# Patient Record
Sex: Female | Born: 1965 | Race: Black or African American | Hispanic: No | Marital: Single | Smoking: Never smoker
Health system: Southern US, Community
[De-identification: ages and names within clinical notes are randomized; demographics above are authoritative.]

## PROBLEM LIST (undated history)

## (undated) DIAGNOSIS — C50919 Malignant neoplasm of unspecified site of unspecified female breast: Secondary | ICD-10-CM

## (undated) DIAGNOSIS — Z923 Personal history of irradiation: Secondary | ICD-10-CM

## (undated) HISTORY — DX: Malignant neoplasm of unspecified site of unspecified female breast: C50.919

## (undated) HISTORY — PX: WISDOM TOOTH EXTRACTION: SHX21

## (undated) HISTORY — DX: Personal history of irradiation: Z92.3

---
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
## 2000-07-19 ENCOUNTER — Inpatient Hospital Stay (HOSPITAL_COMMUNITY): Admission: AD | Admit: 2000-07-19 | Discharge: 2000-07-21 | Payer: Self-pay | Admitting: Obstetrics

## 2000-07-24 ENCOUNTER — Inpatient Hospital Stay (HOSPITAL_COMMUNITY): Admission: AD | Admit: 2000-07-24 | Discharge: 2000-07-24 | Payer: Self-pay | Admitting: Obstetrics & Gynecology

## 2010-04-25 ENCOUNTER — Emergency Department (HOSPITAL_COMMUNITY): Admission: EM | Admit: 2010-04-25 | Discharge: 2010-04-25 | Payer: Self-pay | Admitting: Emergency Medicine

## 2010-12-13 NOTE — Op Note (Signed)
[facility]  Patient:    Michelle Petty, Michelle Petty                        MRN: [id number] Dictator:   Jamey Reas, M.D.                           Operative Report  DATE OF BIRTH:                04-11-1966  PREOPERATIVE DIAGNOSES:       Nonreassuring fetal heart tones with persistent deep variables with late onset.  POSTOPERATIVE DIAGNOSES:      Nonreassuring fetal heart tones with persistent deep variables with late onset, uterine rupture in the right lower segment approximately 2 cm in size with extravasation of amnio-infusion, persistent OP, then asynclitism of the fetal head.  PROCEDURE:                    Primary low transverse cesarean section via Pfannenstiel.  SURGEON:                      Georgina Peer, M.D.  ASSISTANT:                    Jamey Reas, M.D.  ANESTHESIA:                   Epidural.  COMPLICATIONS:                None.  ESTIMATED BLOOD LOSS:         900 cc.  FLUIDS:                       Lactated Ringers 1500 cc.  URINE OUTPUT:                 200 cc clear urine at the end of the procedure.  INDICATIONS:                  A 45 year old G2, P1-0-0-1 approximately 64 and 4 with poor dating secondary to no prenatal care presented in early labor. Deep variable decelerations with labor.  FINDINGS:                     Viable female infant in cephalic presentation, persisting OP, and asynclitism of the fetal head, clear fluid at the time of delivery.  Apgars 9 and 9.  Cord pH 7.20.  Approximately 2 cm in size uterine rupture in the right lower uterine segment with extravasation of amnio-infusion fluid into the lower uterine segment and broad ligament structures bilaterally.  PROCEDURE:                    Patient was taken to the operating room where epidural anesthesia was found to be adequate.  She was then prepped and draped in the normal sterile fashion in the dorsal supine position with a leftward tilt.   A Pfannenstiel skin incision was then made with the scalpel and carried through to the underlying layer of fascia.  The fascia was incised in the midline, the incision extended laterally with the Mayo scissors.  The superior aspect of the fascial incision was then grasped with Kocher clamps, elevated, and the underlying rectus muscles dissected off bluntly.  Attention was then turned to the inferior aspect of the incision  which in a similar fashion was grasped, tented up with Kocher clamps, and the rectus muscle dissected off bluntly.  The rectus muscles were then separated in the midline, the peritoneum identified, tented up, and entered sharply with Metzenbaum scissors.  The peritoneal incision was then extended superiorly and inferiorly with good visualization of the bladder.  Upon entering the peritoneal cavity it was noted a significant amount of clear fluid had extravasated into the lower uterine segment as well as the bilateral and broad ligament structures. The lower uterine segment was inspected well and a 2 cm hole was found in the right lower uterine segment.  At that point the vesicouterine peritoneum was identified, tented up, and incision was made extending laterally after insertion of the bladder blade.  The incision was extended laterally.  A bladder flap was created digitally.  Starting at the rupture site in the right lower uterine segment the uterine incision was made medially and laterally cephalad direction using bandage scissors.  The fetal head was delivered atraumatically after the bladder blade was removed.  The nose and mouth were suctioned with bulb suction, the cord clamped and cut.  The infant was handed to the awaiting pediatricians.  Cord gases were sent.  The placenta was then removed manually, the uterus exteriorized and cleared of all clots and debris. Uterine incision and extension were repaired with 0 Vicryl suture in a running locked fashion.  A second  layer of suture was used on the extension to achieve excellent hemostasis.  The uterus was returned to the abdomen where the incision was found to be hemostatic after several interrupted figure-of-eight sutures were applied to the lower uterine segment.  The gutters were cleared of all clots and debris using adequate irritation.  The fascia was reapproximated with 0 Vicryl in a running fashion.  Skin was closed with staples.  The patient tolerated procedure well.  Sponge, lap, and needle counts were correct x 2.  Cefotan 2 g was given at cord clamp.  The patient was taken to the recovery room in stable condition. DD:  07/19/00 TD:  07/19/00 Job: [id number]

## 2010-12-13 NOTE — Discharge Summary (Signed)
[facility]  Patient:    Michelle Petty, Michelle Petty                        MRN: [id number] Adm. Date:  [id number] Disc. Date: 07/21/00 Attending:  Tammi Sou Dictator:   Kinnie Scales. Reed Breech, M.D. CC:         Georgina Peer, M.D.   Discharge Summary  DATE OF BIRTH:                February 27, 1966.  DISCHARGE DIAGNOSES:          1. Status post low transverse cesarean section                                  secondary to uterine rupture and                                  non-reassuring fetal heart tones.                               2. Uterine rupture.                               3. Postoperative anemia.  DISCHARGE MEDICATIONS:        1. Ibuprofen 600 mg one p.o. q.6-8h. p.r.n.                                  pain.                               2. Darvocet-N 100 one to two p.o. q.6h. p.r.n.                                  pain.                               3. Prenatal vitamins, one p.o. q.d. x 6 weeks.                               4. Iron sulfate 325 mg one p.o. b.i.d. x 4                                  weeks.                               5. Genora 1/35 one p.o. q.d.  CONSULTANTS:                  None.  PROCEDURE:                    Low transverse cesarean section on  July 19, 2000, Dr. Georgina Peer and                               Dr. Jamey Reas.  ADMISSION HISTORY:            [age]-year-old gravida 2, para 1-0-0-1, presents at 40 weeks and 4 days with contractions, positive fetal movement. Said she had contractions at 1600 hours today, now getting closer together and stronger pain, rated at 7/10, tolerable but forceful.  Patient reports that it is an unplanned pregnancy and she has not had any prenatal care. Approximately four months previously, she was seen at "a place on [address]" for ultrasound at 16 weeks; was at a clinic for possible termination  of pregnancy.  PHYSICAL EXAMINATION:         Patient was afebrile, noted to have normal blood pressure of 118/72, pulse 70.  Fetal heart tones 130 to 140, no accelerations, no decelerations.  Uterine contractions every two to five minutes.  Cervix dilated 3/50/-2, vertex.  Urine drug screen negative.  Hemoglobin 8.4. Patient was admitted to L&D for expectant management.  HOSPITAL COURSE:              OB:  On July 19, 2000, patient was noted to have variable decelerations with fetal heart tones dropping to 60 to 70 beats a minute for one to two minutes since rupture of membrane.  Patient repositioned and variables resolved.  Patient was then noted in the afternoon on the 23rd to have non-reassuring fetal heart rate with persistent frequent variables and late onset, unresponsive to conservative measures.  Patient was taken to a C-section.  Patient underwent an LTCS and it was noted that she had a 2-cm uterine rupture at right lower segment and extravasation of amnioinfusion fluid into lower uterine segment and broad ligaments bilaterally.  Patient had an estimated blood loss of 900 cc.  She tolerated procedure well.  Postop, patient managing well, decided to keep baby and would like to be discharged on July 21, 2000.  Discharge ______, patient is doing well today, still has some abdominal discomfort but ambulating without too much difficulty.  She denies any nausea or vomiting.  Patient is afebrile at 98.0, heart rate 84, blood pressure 100/70.  Clear to auscultation bilaterally.  Regular rate and rhythm.  Fundus 1 cm below umbilicus and firm. Extremities:  No cyanosis, clubbing or edema.  Hemoglobin 6.7.  FOLLOWUP:                     Patient will follow up with [facility] in six weeks.  She will take prenatal vitamins for six weeks as well as iron supplementation for one month with vitamin C.  Patient is encouraged not to labor again secondary to her uterine rupture.  ______  and take oral contraceptive pills. DD:  07/21/00 TD:  07/21/00 Job: [id number]

## 2012-07-28 DIAGNOSIS — Z923 Personal history of irradiation: Secondary | ICD-10-CM

## 2012-07-28 HISTORY — DX: Personal history of irradiation: Z92.3

## 2012-12-22 ENCOUNTER — Ambulatory Visit: Payer: Self-pay | Admitting: Obstetrics

## 2013-01-06 ENCOUNTER — Ambulatory Visit (INDEPENDENT_AMBULATORY_CARE_PROVIDER_SITE_OTHER): Payer: Medicaid Other | Admitting: Obstetrics

## 2013-01-06 ENCOUNTER — Encounter: Payer: Self-pay | Admitting: Obstetrics

## 2013-01-06 VITALS — BP 112/77 | HR 74 | Temp 98.6°F | Ht 68.0 in | Wt 152.0 lb

## 2013-01-06 DIAGNOSIS — Z0000 Encounter for general adult medical examination without abnormal findings: Secondary | ICD-10-CM

## 2013-01-06 DIAGNOSIS — Z113 Encounter for screening for infections with a predominantly sexual mode of transmission: Secondary | ICD-10-CM

## 2013-01-06 DIAGNOSIS — N760 Acute vaginitis: Secondary | ICD-10-CM

## 2013-01-06 DIAGNOSIS — N921 Excessive and frequent menstruation with irregular cycle: Secondary | ICD-10-CM

## 2013-01-06 DIAGNOSIS — Z1239 Encounter for other screening for malignant neoplasm of breast: Secondary | ICD-10-CM

## 2013-01-06 DIAGNOSIS — N949 Unspecified condition associated with female genital organs and menstrual cycle: Secondary | ICD-10-CM

## 2013-01-06 DIAGNOSIS — Z01419 Encounter for gynecological examination (general) (routine) without abnormal findings: Secondary | ICD-10-CM

## 2013-01-06 LAB — CBC
HCT: 35.5 % — ABNORMAL LOW (ref 36.0–46.0)
Hemoglobin: 12.1 g/dL (ref 12.0–15.0)
MCH: 28.1 pg (ref 26.0–34.0)
MCHC: 34.1 g/dL (ref 30.0–36.0)
MCV: 82.6 fL (ref 78.0–100.0)

## 2013-01-06 LAB — COMPREHENSIVE METABOLIC PANEL
ALT: 10 U/L (ref 0–35)
AST: 16 U/L (ref 0–37)
Albumin: 4.1 g/dL (ref 3.5–5.2)
Alkaline Phosphatase: 53 U/L (ref 39–117)
Calcium: 9.1 mg/dL (ref 8.4–10.5)
Chloride: 104 mEq/L (ref 96–112)
Potassium: 4.3 mEq/L (ref 3.5–5.3)
Sodium: 139 mEq/L (ref 135–145)
Total Protein: 7.2 g/dL (ref 6.0–8.3)

## 2013-01-06 MED ORDER — METRONIDAZOLE 500 MG PO TABS: 500.0000 mg | ORAL_TABLET | Freq: Two times a day (BID) | ORAL | Status: DC

## 2013-01-06 NOTE — Progress Notes (Signed)
Subjective:     Michelle Petty is a 47 y.o. female here for a routine exam with STD screen.  Current complaints: irregular cycles.     Gynecologic History Patient's last menstrual period was 01/01/2013. Contraception: abstinence Last Pap: 2008. Results were: normal Last mammogram: n/a. Results were: n/a     The following portions of the patient's history were reviewed and updated as appropriate: allergies, current medications, past family history, past medical history, past social history, past surgical history and problem list.  Review of Systems Pertinent items are noted in HPI.    Objective:    General appearance: alert and no distress Breasts: normal appearance, no masses or tenderness Abdomen: normal findings: soft, non-tender Pelvic: cervix normal in appearance, external genitalia normal, no adnexal masses or tenderness, no cervical motion tenderness, rectovaginal septum normal and uterus normal size, shape, and consistency   Vagina with grayish discharge.  Assessment:    Healthy female exam.    Metrorrhagia.  BV.   Plan:    Education reviewed: calcium supplements, self breast exams, weight bearing exercise and BV. Mammogram ordered. Follow up in: 1 year. Flagyl Rx.   Sonohysterogram ordered.

## 2013-01-07 ENCOUNTER — Ambulatory Visit (HOSPITAL_COMMUNITY)
Admission: RE | Admit: 2013-01-07 | Discharge: 2013-01-07 | Disposition: A | Discharge status: Home / Self Care | Payer: Medicaid Other | Source: Ambulatory Visit | Attending: Obstetrics | Admitting: Obstetrics

## 2013-01-07 DIAGNOSIS — Z1231 Encounter for screening mammogram for malignant neoplasm of breast: Secondary | ICD-10-CM | POA: Insufficient documentation

## 2013-01-07 DIAGNOSIS — Z1239 Encounter for other screening for malignant neoplasm of breast: Secondary | ICD-10-CM

## 2013-01-07 DIAGNOSIS — N921 Excessive and frequent menstruation with irregular cycle: Secondary | ICD-10-CM | POA: Insufficient documentation

## 2013-01-07 DIAGNOSIS — N760 Acute vaginitis: Secondary | ICD-10-CM | POA: Insufficient documentation

## 2013-01-07 LAB — RPR

## 2013-01-07 LAB — HEPATITIS B SURFACE ANTIGEN: Hepatitis B Surface Ag: NEGATIVE

## 2013-01-10 LAB — PAP IG W/ RFLX HPV ASCU

## 2013-01-11 ENCOUNTER — Other Ambulatory Visit: Payer: Self-pay | Admitting: Obstetrics

## 2013-01-11 DIAGNOSIS — R928 Other abnormal and inconclusive findings on diagnostic imaging of breast: Secondary | ICD-10-CM

## 2013-01-19 ENCOUNTER — Other Ambulatory Visit: Payer: Medicaid Other

## 2013-01-19 ENCOUNTER — Ambulatory Visit: Payer: Medicaid Other | Admitting: Obstetrics

## 2013-01-21 ENCOUNTER — Other Ambulatory Visit: Payer: Self-pay | Admitting: Obstetrics

## 2013-01-21 ENCOUNTER — Ambulatory Visit
Admission: RE | Admit: 2013-01-21 | Discharge: 2013-01-21 | Disposition: A | Discharge status: Home / Self Care | Payer: Medicaid Other | Source: Ambulatory Visit | Attending: Obstetrics | Admitting: Obstetrics

## 2013-01-21 DIAGNOSIS — R928 Other abnormal and inconclusive findings on diagnostic imaging of breast: Secondary | ICD-10-CM

## 2013-01-21 DIAGNOSIS — R921 Mammographic calcification found on diagnostic imaging of breast: Secondary | ICD-10-CM

## 2013-01-21 DIAGNOSIS — C50919 Malignant neoplasm of unspecified site of unspecified female breast: Secondary | ICD-10-CM

## 2013-01-21 HISTORY — DX: Malignant neoplasm of unspecified site of unspecified female breast: C50.919

## 2013-01-21 HISTORY — PX: BREAST BIOPSY: SHX20

## 2013-01-24 ENCOUNTER — Other Ambulatory Visit: Payer: Self-pay | Admitting: Obstetrics

## 2013-01-24 DIAGNOSIS — C50912 Malignant neoplasm of unspecified site of left female breast: Secondary | ICD-10-CM

## 2013-01-25 ENCOUNTER — Ambulatory Visit (INDEPENDENT_AMBULATORY_CARE_PROVIDER_SITE_OTHER): Payer: Medicaid Other

## 2013-01-25 ENCOUNTER — Encounter: Payer: Self-pay | Admitting: Obstetrics

## 2013-01-25 ENCOUNTER — Ambulatory Visit (INDEPENDENT_AMBULATORY_CARE_PROVIDER_SITE_OTHER): Payer: Medicaid Other | Admitting: Obstetrics

## 2013-01-25 ENCOUNTER — Other Ambulatory Visit: Payer: Self-pay

## 2013-01-25 VITALS — BP 136/84 | HR 78 | Temp 98.5°F | Wt 152.0 lb

## 2013-01-25 DIAGNOSIS — N921 Excessive and frequent menstruation with irregular cycle: Secondary | ICD-10-CM

## 2013-01-25 DIAGNOSIS — Z853 Personal history of malignant neoplasm of breast: Secondary | ICD-10-CM

## 2013-01-25 DIAGNOSIS — N946 Dysmenorrhea, unspecified: Secondary | ICD-10-CM

## 2013-01-25 DIAGNOSIS — N949 Unspecified condition associated with female genital organs and menstrual cycle: Secondary | ICD-10-CM

## 2013-01-25 HISTORY — DX: Personal history of malignant neoplasm of breast: Z85.3

## 2013-01-25 NOTE — Progress Notes (Signed)
SONOHYSTEROGRAM PROCEDURE NOTE  SONOHYSTEROGRAM PROCEDURE:   A time-out was performed confirming patient name, allergy status and procedure.  A UPT was negative.  A Graves speculum was placed in the vagina.  The cervix was prepped with betadine.  The cervix was grasped with a single tooth tenaculum.  The IUI catherter was primed with sterile water.  The IUI was introduced into the uterine cavity.  The single tooth tenaculum and speculum were removed.  The vaginal probe was placed.  The sterile water was instilled in the uterine cavity.    Sterile water was aspirated.  10 ml syringe attached to IUI and an endometrial biopsy obtained and submitted to Pathology for evaluation. The IUI was removed.  The single tooth tenaculum was removed with minimal bleeding noted from the cervix.  The patient tolerated the procedure well.   FINDINGS:  Normal endometrial cavity.

## 2013-01-26 ENCOUNTER — Other Ambulatory Visit: Payer: Self-pay | Admitting: *Deleted

## 2013-01-26 ENCOUNTER — Telehealth: Payer: Self-pay | Admitting: *Deleted

## 2013-01-26 ENCOUNTER — Encounter: Payer: Self-pay | Admitting: Obstetrics

## 2013-01-26 DIAGNOSIS — C50112 Malignant neoplasm of central portion of left female breast: Secondary | ICD-10-CM

## 2013-01-26 NOTE — Telephone Encounter (Signed)
Called and spoke with patient and confirmed [facility] appt. For 02/02/13 at 1200N.  Instructions and contact information given. No further questions or concerns at this time.

## 2013-01-31 ENCOUNTER — Ambulatory Visit
Admission: RE | Admit: 2013-01-31 | Discharge: 2013-01-31 | Disposition: A | Discharge status: Home / Self Care | Payer: Medicaid Other | Source: Ambulatory Visit | Attending: Obstetrics | Admitting: Obstetrics

## 2013-01-31 DIAGNOSIS — C50912 Malignant neoplasm of unspecified site of left female breast: Secondary | ICD-10-CM

## 2013-01-31 MED ORDER — GADOBENATE DIMEGLUMINE 529 MG/ML IV SOLN
14.0000 mL | Freq: Once | INTRAVENOUS | Status: AC | PRN
  Administered 2013-01-31: 14 mL via INTRAVENOUS

## 2013-02-02 ENCOUNTER — Encounter: Payer: Self-pay | Admitting: *Deleted

## 2013-02-02 ENCOUNTER — Ambulatory Visit: Payer: Medicaid Other

## 2013-02-02 ENCOUNTER — Ambulatory Visit (HOSPITAL_BASED_OUTPATIENT_CLINIC_OR_DEPARTMENT_OTHER): Payer: Medicaid Other | Admitting: Surgery

## 2013-02-02 ENCOUNTER — Ambulatory Visit
Admission: RE | Admit: 2013-02-02 | Discharge: 2013-02-02 | Disposition: A | Discharge status: Home / Self Care | Payer: Medicaid Other | Source: Ambulatory Visit | Attending: Radiation Oncology | Admitting: Radiation Oncology

## 2013-02-02 ENCOUNTER — Other Ambulatory Visit (HOSPITAL_BASED_OUTPATIENT_CLINIC_OR_DEPARTMENT_OTHER): Payer: Medicaid Other | Admitting: Lab

## 2013-02-02 ENCOUNTER — Ambulatory Visit: Payer: Medicaid Other | Attending: Surgery | Admitting: Physical Therapy

## 2013-02-02 ENCOUNTER — Encounter: Payer: Self-pay | Admitting: Oncology

## 2013-02-02 ENCOUNTER — Encounter (INDEPENDENT_AMBULATORY_CARE_PROVIDER_SITE_OTHER): Payer: Self-pay | Admitting: Surgery

## 2013-02-02 ENCOUNTER — Ambulatory Visit (HOSPITAL_BASED_OUTPATIENT_CLINIC_OR_DEPARTMENT_OTHER): Payer: Medicaid Other | Admitting: Oncology

## 2013-02-02 VITALS — BP 128/83 | HR 71 | Temp 98.4°F | Resp 20 | Ht 68.0 in | Wt 150.7 lb

## 2013-02-02 DIAGNOSIS — C50112 Malignant neoplasm of central portion of left female breast: Secondary | ICD-10-CM

## 2013-02-02 DIAGNOSIS — C50919 Malignant neoplasm of unspecified site of unspecified female breast: Secondary | ICD-10-CM

## 2013-02-02 DIAGNOSIS — C50312 Malignant neoplasm of lower-inner quadrant of left female breast: Secondary | ICD-10-CM

## 2013-02-02 DIAGNOSIS — C50912 Malignant neoplasm of unspecified site of left female breast: Secondary | ICD-10-CM

## 2013-02-02 DIAGNOSIS — C50119 Malignant neoplasm of central portion of unspecified female breast: Secondary | ICD-10-CM

## 2013-02-02 DIAGNOSIS — D0590 Unspecified type of carcinoma in situ of unspecified breast: Secondary | ICD-10-CM

## 2013-02-02 DIAGNOSIS — IMO0001 Reserved for inherently not codable concepts without codable children: Secondary | ICD-10-CM | POA: Insufficient documentation

## 2013-02-02 DIAGNOSIS — Z01818 Encounter for other preprocedural examination: Secondary | ICD-10-CM | POA: Insufficient documentation

## 2013-02-02 LAB — COMPREHENSIVE METABOLIC PANEL (CC13)
Alkaline Phosphatase: 53 U/L (ref 40–150)
Glucose: 93 mg/dl (ref 70–140)
Sodium: 139 mEq/L (ref 136–145)
Total Bilirubin: 0.37 mg/dL (ref 0.20–1.20)
Total Protein: 7.3 g/dL (ref 6.4–8.3)

## 2013-02-02 LAB — CBC WITH DIFFERENTIAL/PLATELET
EOS%: 1.2 % (ref 0.0–7.0)
Eosinophils Absolute: 0.1 10*3/uL (ref 0.0–0.5)
LYMPH%: 25.6 % (ref 14.0–49.7)
MCH: 28.8 pg (ref 25.1–34.0)
MCHC: 34.2 g/dL (ref 31.5–36.0)
MCV: 84.2 fL (ref 79.5–101.0)
MONO%: 9 % (ref 0.0–14.0)
NEUT#: 4.1 10*3/uL (ref 1.5–6.5)
Platelets: 243 10*3/uL (ref 145–400)
RBC: 4.24 10*6/uL (ref 3.70–5.45)

## 2013-02-02 NOTE — Progress Notes (Signed)
Patient ID: Michelle Petty, female   DOB: [birth year], 47 y.o.   MRN: [id number]  Chief Complaint  Patient presents with  . Other    left breast cancer    HPI Michelle Petty is a 47 y.o. female.   HPI This is a very pleasant lady referred by Dr. Coral Ceo after the recent diagnosis of a left breast cancer. This was found on screening mammography. She had abnormal calcifications which were biopsied. She has had no problems with her breasts. She denies nipple discharge. She has had no previous biopsies. She is otherwise without complaints. Past Medical History  Diagnosis Date  . Breast cancer     Past Surgical History  Procedure Laterality Date  . Achilles tendon repair Left 1985  . Breast biopsy Left 01/21/13    malignant     Family History  Problem Relation Age of Onset  . Diabetes Mother   . Diabetes Maternal Grandmother     Social History History  Substance Use Topics  . Smoking status: Never Smoker   . Smokeless tobacco: Never Used  . Alcohol Use: No    No Known Allergies  Current Outpatient Prescriptions  Medication Sig Dispense Refill  . ibuprofen (ADVIL,MOTRIN) 600 MG tablet Take 600 mg by mouth every 6 (six) hours as needed for pain.       No current facility-administered medications for this visit.    Review of Systems Review of Systems  Constitutional: Negative for fever, chills and unexpected weight change.  HENT: Negative for hearing loss, congestion, sore throat, trouble swallowing and voice change.   Eyes: Negative for visual disturbance.  Respiratory: Negative for cough and wheezing.   Cardiovascular: Negative for chest pain, palpitations and leg swelling.  Gastrointestinal: Negative for nausea, vomiting, abdominal pain, diarrhea, constipation, blood in stool, abdominal distention and anal bleeding.  Genitourinary: Negative for hematuria, vaginal bleeding and difficulty urinating.  Musculoskeletal: Negative for arthralgias.  Skin: Negative for  rash and wound.  Neurological: Negative for seizures, syncope and headaches.  Hematological: Negative for adenopathy. Does not bruise/bleed easily.  Psychiatric/Behavioral: Negative for confusion.    Last menstrual period 01/01/2013.  Physical Exam Physical Exam  Constitutional: She is oriented to person, place, and time. She appears well-developed and well-nourished. No distress.  HENT:  Head: Normocephalic and atraumatic.  Right Ear: External ear normal.  Left Ear: External ear normal.  Nose: Nose normal.  Mouth/Throat: Oropharynx is clear and moist. No oropharyngeal exudate.  Eyes: Conjunctivae are normal. Pupils are equal, round, and reactive to light. Right eye exhibits no discharge. Left eye exhibits no discharge. No scleral icterus.  Neck: Normal range of motion. Neck supple. No tracheal deviation present. No thyromegaly present.  Cardiovascular: Normal rate, regular rhythm, normal heart sounds and intact distal pulses.   No murmur heard. Pulmonary/Chest: Effort normal and breath sounds normal. No respiratory distress. She has no wheezes. She has no rales.  Abdominal: Soft. Bowel sounds are normal.  Musculoskeletal: Normal range of motion. She exhibits no edema.  Lymphadenopathy:    She has no cervical adenopathy.  Neurological: She is alert and oriented to person, place, and time.  Skin: Skin is warm and dry. No rash noted. She is not diaphoretic. No erythema.  Psychiatric: Her behavior is normal. Judgment normal.    Data Reviewed I reviewed her mammograms MRI and biopsy results showing the invasive left breast cancer which is ER and PR positive.  Assessment    Left breast cancer     Plan  After discussion in the multidisciplinary conference, needle localized left breast lumpectomy and sentinel lymph node biopsy was recommended. I've discussed this with her in detail. I discussed the risks of surgery which includes but is not limited to bleeding, infection, need for  further surgery should the margins are noted to be positive, et Karie Soda. She understands and wished to proceed. Also discussed postoperative recovery. Surgery will be scheduled        Bobie Kistler A 02/02/2013, 2:24 PM

## 2013-02-02 NOTE — Progress Notes (Signed)
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 female who is seen today at the [facility] for evaluation of her T1a N0 M0 invasive ductal/DCIS of the left breast. At the time of a screening mammogram on 01/07/2013 at the [facility] she was felt to have possible masses in the right breast and calcifications within the left breast. Additional views at the [facility] on 01/21/2013 showed subareolar left breast calcifications a. Biopsy was diagnostic for invasive ductal carcinoma along with DCIS. Her invasive disease was ER positive at 100% and PR positive at 60% with a proliferation Ki-67 marker of 11%. Breast MR on 01/31/2013 showed a 2.5 x 2.8 x 2.2 cm hematoma along the lower inner periareolar region at the biopsy site. Lymph nodes appear to be normal. She is without complaints today. She seen today with Dr. Magnus Ivan and Dr. Welton Flakes.  PREVIOUS RADIATION THERAPY: No   PAST MEDICAL HISTORY:  has a past medical history of Breast cancer.     PAST SURGICAL HISTORY:  Past Surgical History  Procedure Laterality Date  . Achilles tendon repair Left 1985  . Breast biopsy Left 01/21/13    malignant      FAMILY HISTORY: family history includes Diabetes in her maternal grandmother and mother. Her father is alive at 23 and doing well. Her mother is alive at 69 of diabetes mellitus. No family history of breast cancer.   SOCIAL HISTORY:  reports that she has never smoked. She has never used smokeless tobacco. She reports that she does not drink alcohol or use illicit drugs. She works in housekeeping. Single, 10 and 75 year old  children.   ALLERGIES: Review of patient's allergies indicates no known allergies.   MEDICATIONS:  Current Outpatient Prescriptions  Medication Sig Dispense Refill  . ibuprofen (ADVIL,MOTRIN) 600 MG tablet Take 600 mg by mouth every 6 (six) hours as needed for pain.       No current facility-administered medications for this encounter.     REVIEW OF SYSTEMS:  Pertinent items are noted in HPI.    PHYSICAL EXAM: Alert and oriented 47 year old African American female appearing younger than her stated age. Wt Readings from Last 3 Encounters:  02/02/13 150 lb 11.2 oz (68.357 kg)  01/25/13 152 lb (68.947 kg)  01/06/13 152 lb (68.947 kg)   Temp Readings from Last 3 Encounters:  02/02/13 98.4 F (36.9 C) Oral  01/25/13 98.5 F (36.9 C) Oral  01/06/13 98.6 F (37 C) Oral   BP Readings from Last 3 Encounters:  02/02/13 128/83  01/25/13 136/84  01/06/13 112/77   Pulse Readings from Last 3 Encounters:  02/02/13 71  01/25/13 78  01/06/13 74   Head and neck examination: Grossly unremarkable. Nodes: Without palpable cervical, supraclavicular, or axillary lymphadenopathy. Chest: Lungs clear. Back: Without spinal or CVA tenderness. Heart: Regular rate and rhythm. Breasts: There is a palpable subareolar mass in approximately 9:00 measuring 2 cm. There is no other palpable abnormality. Right breast without masses or lesions. Abdomen: Without hepatomegaly. Extremities: Without edema.     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 I explained to the patient that  her local treatment options include a partial mastectomy followed by radiation therapy or mastectomy. Dr. Magnus Ivan may need to remove the nipple areolar complex to obtain clear margins. We discussed the potential acute and late toxicities of radiation therapy. We also discussed the possibility of hypo-fractionated radiation therapy, but I am less keen on hyperfractionated treatment for patients age 7. We also discussed genetic testing even with a negative family history. Her prognosis appears to be favorable.   PLAN: As discussed above. She will be represented at the Wednesday morning conference after her definitive surgery.  I spent 30 minutes minutes face to face with the patient and more than 50% of that time was spent in counseling and/or coordination of care.

## 2013-02-02 NOTE — Patient Instructions (Addendum)
Proceed with surgery with a lumpectomy and sentinel lymph node biopsy.  I will see you back in 4-5 weeks time

## 2013-02-03 ENCOUNTER — Telehealth: Payer: Self-pay | Admitting: Oncology

## 2013-02-05 ENCOUNTER — Encounter: Payer: Self-pay | Admitting: *Deleted

## 2013-02-05 NOTE — Progress Notes (Signed)
Mailed after appt letter to pt. 

## 2013-02-08 ENCOUNTER — Telehealth: Payer: Self-pay | Admitting: *Deleted

## 2013-02-08 NOTE — Telephone Encounter (Signed)
Left message for a return phone call.

## 2013-02-09 ENCOUNTER — Other Ambulatory Visit (INDEPENDENT_AMBULATORY_CARE_PROVIDER_SITE_OTHER): Payer: Self-pay | Admitting: General Surgery

## 2013-02-09 DIAGNOSIS — C50912 Malignant neoplasm of unspecified site of left female breast: Secondary | ICD-10-CM

## 2013-02-10 ENCOUNTER — Ambulatory Visit (INDEPENDENT_AMBULATORY_CARE_PROVIDER_SITE_OTHER): Payer: Medicaid Other | Admitting: Obstetrics

## 2013-02-10 VITALS — BP 115/77 | HR 70 | Temp 98.6°F | Wt 150.0 lb

## 2013-02-10 DIAGNOSIS — N921 Excessive and frequent menstruation with irregular cycle: Secondary | ICD-10-CM

## 2013-02-10 MED ORDER — MEDROXYPROGESTERONE ACETATE 150 MG/ML IM SUSP: 150.0000 mg | INTRAMUSCULAR | Status: DC

## 2013-02-10 MED ORDER — MEDROXYPROGESTERONE ACETATE 10 MG PO TABS: 10.0000 mg | ORAL_TABLET | Freq: Every day | ORAL | Status: DC

## 2013-02-10 NOTE — Progress Notes (Signed)
Subjective:     Michelle Petty is a 47 y.o. female here for a routine exam.  Current complaints: test results for hsg.  Personal health questionnaire reviewed: yes.   Gynecologic History Patient's last menstrual period was 01/26/2013. Contraception: none Last Pap: 2014. Results were: normal Last mammogram: 2014. Results were: abnormal- dx with breast cancer and is having a lumpectomy on 02-23-13  Obstetric History OB History   Grav Para Term Preterm Abortions TAB SAB Ect Mult Living   2 2 2  0 0 0 0 0 0 2     # Outc Date GA Lbr Len/2nd Wgt Sex Del Anes PTL Lv   1 TRM            2 TRM                The following portions of the patient's history were reviewed and updated as appropriate: allergies, current medications, past family history, past medical history, past social history, past surgical history and problem list.  Review of Systems Pertinent items are noted in HPI.    Objective:    No exam performed today, Consult only..    Assessment:    Metrorrhagia.  Ultrasound WNL's.   Plan:    Education reviewed: Metrorrhagia. Follow up in: 3 months. Provera Rx

## 2013-02-11 ENCOUNTER — Encounter: Payer: Self-pay | Admitting: Obstetrics

## 2013-02-18 ENCOUNTER — Encounter (HOSPITAL_BASED_OUTPATIENT_CLINIC_OR_DEPARTMENT_OTHER): Payer: Self-pay | Admitting: *Deleted

## 2013-02-18 NOTE — Progress Notes (Signed)
Had labs 02/02/13-no other test needed Pt stopped her hormones

## 2013-02-21 NOTE — H&P (Signed)
Patient ID: Michelle Petty, female DOB: [birth year], 47 y.o. MRN: [id number]  Chief Complaint   Patient presents with   .  Other     left breast cancer   HPI  Michelle Petty is a 47 y.o. female.  HPI  This is a very pleasant lady referred by Dr. Coral Ceo after the recent diagnosis of a left breast cancer. This was found on screening mammography. She had abnormal calcifications which were biopsied. She has had no problems with her breasts. She denies nipple discharge. She has had no previous biopsies. She is otherwise without complaints.  Past Medical History   Diagnosis  Date   .  Breast cancer     Past Surgical History   Procedure  Laterality  Date   .  Achilles tendon repair  Left  1985   .  Breast biopsy  Left  01/21/13     malignant    Family History   Problem  Relation  Age of Onset   .  Diabetes  Mother    .  Diabetes  Maternal Grandmother    Social History  History   Substance Use Topics   .  Smoking status:  Never Smoker   .  Smokeless tobacco:  Never Used   .  Alcohol Use:  No   No Known Allergies  Current Outpatient Prescriptions   Medication  Sig  Dispense  Refill   .  ibuprofen (ADVIL,MOTRIN) 600 MG tablet  Take 600 mg by mouth every 6 (six) hours as needed for pain.      No current facility-administered medications for this visit.   Review of Systems  Review of Systems  Constitutional: Negative for fever, chills and unexpected weight change.  HENT: Negative for hearing loss, congestion, sore throat, trouble swallowing and voice change.  Eyes: Negative for visual disturbance.  Respiratory: Negative for cough and wheezing.  Cardiovascular: Negative for chest pain, palpitations and leg swelling.  Gastrointestinal: Negative for nausea, vomiting, abdominal pain, diarrhea, constipation, blood in stool, abdominal distention and anal bleeding.  Genitourinary: Negative for hematuria, vaginal bleeding and difficulty urinating.  Musculoskeletal: Negative for  arthralgias.  Skin: Negative for rash and wound.  Neurological: Negative for seizures, syncope and headaches.  Hematological: Negative for adenopathy. Does not bruise/bleed easily.  Psychiatric/Behavioral: Negative for confusion.  Last menstrual period 01/01/2013.  Physical Exam  Physical Exam  Constitutional: She is oriented to person, place, and time. She appears well-developed and well-nourished. No distress.  HENT:  Head: Normocephalic and atraumatic.  Right Ear: External ear normal.  Left Ear: External ear normal.  Nose: Nose normal.  Mouth/Throat: Oropharynx is clear and moist. No oropharyngeal exudate.  Eyes: Conjunctivae are normal. Pupils are equal, round, and reactive to light. Right eye exhibits no discharge. Left eye exhibits no discharge. No scleral icterus.  Neck: Normal range of motion. Neck supple. No tracheal deviation present. No thyromegaly present.  Cardiovascular: Normal rate, regular rhythm, normal heart sounds and intact distal pulses.  No murmur heard.  Pulmonary/Chest: Effort normal and breath sounds normal. No respiratory distress. She has no wheezes. She has no rales.  Abdominal: Soft. Bowel sounds are normal.  Musculoskeletal: Normal range of motion. She exhibits no edema.  Lymphadenopathy:  She has no cervical adenopathy.  Neurological: She is alert and oriented to person, place, and time.  Skin: Skin is warm and dry. No rash noted. She is not diaphoretic. No erythema.  Psychiatric: Her behavior is normal. Judgment normal.  Data Reviewed  I  reviewed her mammograms MRI and biopsy results showing the invasive left breast cancer which is ER and PR positive.  Assessment  Left breast cancer  Plan  After discussion in the multidisciplinary conference, needle localized left breast lumpectomy and sentinel lymph node biopsy was recommended. I've discussed this with her in detail. I discussed the risks of surgery which includes but is not limited to bleeding,  infection, need for further surgery should the margins are noted to be positive, et Karie Soda. She understands and wished to proceed. Also discussed postoperative recovery. Surgery will be scheduled

## 2013-02-23 ENCOUNTER — Encounter (HOSPITAL_BASED_OUTPATIENT_CLINIC_OR_DEPARTMENT_OTHER): Payer: Self-pay | Admitting: *Deleted

## 2013-02-23 ENCOUNTER — Encounter (HOSPITAL_COMMUNITY)
Admission: RE | Admit: 2013-02-23 | Discharge: 2013-02-23 | Disposition: A | Discharge status: Home / Self Care | Payer: Medicaid Other | Source: Ambulatory Visit | Attending: Surgery | Admitting: Surgery

## 2013-02-23 ENCOUNTER — Ambulatory Visit (HOSPITAL_BASED_OUTPATIENT_CLINIC_OR_DEPARTMENT_OTHER)
Admission: RE | Admit: 2013-02-23 | Discharge: 2013-02-23 | Disposition: A | Discharge status: Home / Self Care | Payer: Medicaid Other | Source: Ambulatory Visit | Attending: Surgery | Admitting: Surgery

## 2013-02-23 ENCOUNTER — Encounter (HOSPITAL_BASED_OUTPATIENT_CLINIC_OR_DEPARTMENT_OTHER)
Admission: RE | Disposition: A | Discharge status: Home / Self Care | Payer: Self-pay | Source: Ambulatory Visit | Attending: Surgery

## 2013-02-23 ENCOUNTER — Ambulatory Visit (HOSPITAL_BASED_OUTPATIENT_CLINIC_OR_DEPARTMENT_OTHER): Payer: Medicaid Other | Admitting: *Deleted

## 2013-02-23 ENCOUNTER — Ambulatory Visit
Admission: RE | Admit: 2013-02-23 | Discharge: 2013-02-23 | Disposition: A | Discharge status: Home / Self Care | Payer: Medicaid Other | Source: Ambulatory Visit | Attending: Surgery | Admitting: Surgery

## 2013-02-23 DIAGNOSIS — Z170 Estrogen receptor positive status [ER+]: Secondary | ICD-10-CM | POA: Insufficient documentation

## 2013-02-23 DIAGNOSIS — C50919 Malignant neoplasm of unspecified site of unspecified female breast: Secondary | ICD-10-CM | POA: Insufficient documentation

## 2013-02-23 DIAGNOSIS — C50912 Malignant neoplasm of unspecified site of left female breast: Secondary | ICD-10-CM

## 2013-02-23 DIAGNOSIS — D0590 Unspecified type of carcinoma in situ of unspecified breast: Secondary | ICD-10-CM | POA: Insufficient documentation

## 2013-02-23 HISTORY — PX: BREAST LUMPECTOMY: SHX2

## 2013-02-23 HISTORY — PX: BREAST LUMPECTOMY WITH NEEDLE LOCALIZATION AND AXILLARY SENTINEL LYMPH NODE BX: SHX5760

## 2013-02-23 SURGERY — BREAST LUMPECTOMY WITH NEEDLE LOCALIZATION AND AXILLARY SENTINEL LYMPH NODE BX
Anesthesia: General | Site: Breast | Laterality: Left | Wound class: Clean

## 2013-02-23 MED ORDER — EPHEDRINE SULFATE 50 MG/ML IJ SOLN
INTRAMUSCULAR | Status: DC | PRN
  Administered 2013-02-23 (×2): 10 mg via INTRAVENOUS

## 2013-02-23 MED ORDER — OXYCODONE HCL 5 MG PO TABS
5.0000 mg | ORAL_TABLET | ORAL | Status: DC | PRN
Start: 2013-02-23 — End: 2013-02-23

## 2013-02-23 MED ORDER — ONDANSETRON HCL 4 MG/2ML IJ SOLN: 4.0000 mg | Freq: Four times a day (QID) | INTRAMUSCULAR | Status: DC | PRN

## 2013-02-23 MED ORDER — DEXAMETHASONE SODIUM PHOSPHATE 4 MG/ML IJ SOLN
INTRAMUSCULAR | Status: DC | PRN
  Administered 2013-02-23: 10 mg via INTRAVENOUS

## 2013-02-23 MED ORDER — KETOROLAC TROMETHAMINE 30 MG/ML IJ SOLN
INTRAMUSCULAR | Status: DC | PRN
  Administered 2013-02-23: 30 mg via INTRAVENOUS

## 2013-02-23 MED ORDER — SODIUM CHLORIDE 0.9 % IJ SOLN: 3.0000 mL | Freq: Two times a day (BID) | INTRAMUSCULAR | Status: DC

## 2013-02-23 MED ORDER — FENTANYL CITRATE 0.05 MG/ML IJ SOLN
50.0000 ug | INTRAMUSCULAR | Status: DC | PRN
  Administered 2013-02-23: 100 ug via INTRAVENOUS

## 2013-02-23 MED ORDER — LIDOCAINE HCL (CARDIAC) 20 MG/ML IV SOLN
INTRAVENOUS | Status: DC | PRN
  Administered 2013-02-23: 60 mg via INTRAVENOUS

## 2013-02-23 MED ORDER — ACETAMINOPHEN 325 MG PO TABS: 650.0000 mg | ORAL_TABLET | ORAL | Status: DC | PRN

## 2013-02-23 MED ORDER — SODIUM CHLORIDE 0.9 % IJ SOLN: 3.0000 mL | INTRAMUSCULAR | Status: DC | PRN

## 2013-02-23 MED ORDER — OXYCODONE HCL 5 MG PO TABS
5.0000 mg | ORAL_TABLET | Freq: Once | ORAL | Status: AC | PRN
  Administered 2013-02-23: 5 mg via ORAL

## 2013-02-23 MED ORDER — HYDROMORPHONE HCL PF 1 MG/ML IJ SOLN
0.2500 mg | INTRAMUSCULAR | Status: DC | PRN
  Administered 2013-02-23 (×2): 0.5 mg via INTRAVENOUS

## 2013-02-23 MED ORDER — MIDAZOLAM HCL 5 MG/5ML IJ SOLN
INTRAMUSCULAR | Status: DC | PRN
  Administered 2013-02-23: 1 mg via INTRAVENOUS

## 2013-02-23 MED ORDER — ONDANSETRON HCL 4 MG/2ML IJ SOLN: 4.0000 mg | Freq: Once | INTRAMUSCULAR | Status: DC | PRN

## 2013-02-23 MED ORDER — PROPOFOL 10 MG/ML IV BOLUS
INTRAVENOUS | Status: DC | PRN
  Administered 2013-02-23: 200 mg via INTRAVENOUS

## 2013-02-23 MED ORDER — TECHNETIUM TC 99M SULFUR COLLOID FILTERED
1.0000 | Freq: Once | INTRAVENOUS | Status: AC | PRN
  Administered 2013-02-23: 1 via INTRADERMAL

## 2013-02-23 MED ORDER — ACETAMINOPHEN 650 MG RE SUPP: 650.0000 mg | RECTAL | Status: DC | PRN

## 2013-02-23 MED ORDER — BUPIVACAINE-EPINEPHRINE 0.25% -1:200000 IJ SOLN
INTRAMUSCULAR | Status: DC | PRN
  Administered 2013-02-23: 16 mL

## 2013-02-23 MED ORDER — LACTATED RINGERS IV SOLN
INTRAVENOUS | Status: DC
  Administered 2013-02-23 (×2): via INTRAVENOUS

## 2013-02-23 MED ORDER — OXYCODONE HCL 5 MG/5ML PO SOLN: 5.0000 mg | Freq: Once | ORAL | Status: AC | PRN

## 2013-02-23 MED ORDER — 0.9 % SODIUM CHLORIDE (POUR BTL) OPTIME
TOPICAL | Status: DC | PRN
  Administered 2013-02-23: 1000 mL

## 2013-02-23 MED ORDER — CEFAZOLIN SODIUM-DEXTROSE 2-3 GM-% IV SOLR
2.0000 g | INTRAVENOUS | Status: AC
  Administered 2013-02-23: 2 g via INTRAVENOUS

## 2013-02-23 MED ORDER — SODIUM CHLORIDE 0.9 % IV SOLN: 250.0000 mL | INTRAVENOUS | Status: DC | PRN

## 2013-02-23 MED ORDER — MORPHINE SULFATE 4 MG/ML IJ SOLN: 4.0000 mg | INTRAMUSCULAR | Status: DC | PRN

## 2013-02-23 MED ORDER — SODIUM CHLORIDE 0.9 % IJ SOLN
INTRAMUSCULAR | Status: DC | PRN
  Administered 2013-02-23: 13:00:00

## 2013-02-23 MED ORDER — FENTANYL CITRATE 0.05 MG/ML IJ SOLN
INTRAMUSCULAR | Status: DC | PRN
  Administered 2013-02-23: 50 ug via INTRAVENOUS

## 2013-02-23 MED ORDER — ONDANSETRON HCL 4 MG/2ML IJ SOLN
INTRAMUSCULAR | Status: DC | PRN
  Administered 2013-02-23: 4 mg via INTRAVENOUS

## 2013-02-23 MED ORDER — HYDROCODONE-ACETAMINOPHEN 5-325 MG PO TABS: 1.0000 | ORAL_TABLET | ORAL | Status: DC | PRN

## 2013-02-23 MED ORDER — MIDAZOLAM HCL 2 MG/2ML IJ SOLN
1.0000 mg | INTRAMUSCULAR | Status: DC | PRN
  Administered 2013-02-23: 2 mg via INTRAVENOUS

## 2013-02-23 SURGICAL SUPPLY — 51 items
APL SKNCLS STERI-STRIP NONHPOA (GAUZE/BANDAGES/DRESSINGS) ×1
APPLIER CLIP 9.375 MED OPEN (MISCELLANEOUS) ×2
APR CLP MED 9.3 20 MLT OPN (MISCELLANEOUS) ×1
BENZOIN TINCTURE PRP APPL 2/3 (GAUZE/BANDAGES/DRESSINGS) ×2 IMPLANT
BINDER BREAST LRG (GAUZE/BANDAGES/DRESSINGS) IMPLANT
BINDER BREAST XLRG (GAUZE/BANDAGES/DRESSINGS) IMPLANT
BLADE HEX COATED 2.75 (ELECTRODE) ×2 IMPLANT
BLADE SURG 15 STRL LF DISP TIS (BLADE) ×1 IMPLANT
BLADE SURG 15 STRL SS (BLADE) ×2
CANISTER SUCTION 2500CC (MISCELLANEOUS) ×2 IMPLANT
CHLORAPREP W/TINT 26ML (MISCELLANEOUS) ×2 IMPLANT
CLIP APPLIE 9.375 MED OPEN (MISCELLANEOUS) ×1 IMPLANT
CLOTH BEACON ORANGE TIMEOUT ST (SAFETY) ×2 IMPLANT
COVER MAYO STAND STRL (DRAPES) ×2 IMPLANT
COVER PROBE W GEL 5X96 (DRAPES) ×2 IMPLANT
COVER TABLE BACK 60X90 (DRAPES) ×2 IMPLANT
DEVICE DUBIN W/COMP PLATE 8390 (MISCELLANEOUS) ×1 IMPLANT
DRAPE LAPAROSCOPIC ABDOMINAL (DRAPES) ×2 IMPLANT
DRAPE UTILITY XL STRL (DRAPES) ×2 IMPLANT
DRSG TEGADERM 4X4.75 (GAUZE/BANDAGES/DRESSINGS) ×4 IMPLANT
ELECT REM PT RETURN 9FT ADLT (ELECTROSURGICAL) ×2
ELECTRODE REM PT RTRN 9FT ADLT (ELECTROSURGICAL) ×1 IMPLANT
GLOVE BIOGEL PI IND STRL 6.5 (GLOVE) IMPLANT
GLOVE BIOGEL PI IND STRL 7.0 (GLOVE) IMPLANT
GLOVE BIOGEL PI INDICATOR 6.5 (GLOVE) ×1
GLOVE BIOGEL PI INDICATOR 7.0 (GLOVE) ×1
GLOVE EXAM NITRILE MD LF STRL (GLOVE) ×1 IMPLANT
GLOVE SURG SIGNA 7.5 PF LTX (GLOVE) ×2 IMPLANT
GOWN PREVENTION PLUS XLARGE (GOWN DISPOSABLE) ×2 IMPLANT
GOWN STRL NON-REIN LRG LVL3 (GOWN DISPOSABLE) ×2 IMPLANT
KIT MARKER MARGIN INK (KITS) ×1 IMPLANT
NDL HYPO 25X1 1.5 SAFETY (NEEDLE) ×2 IMPLANT
NDL SAFETY ECLIPSE 18X1.5 (NEEDLE) ×1 IMPLANT
NEEDLE HYPO 18GX1.5 SHARP (NEEDLE) ×2
NEEDLE HYPO 25X1 1.5 SAFETY (NEEDLE) ×4 IMPLANT
NS IRRIG 1000ML POUR BTL (IV SOLUTION) ×2 IMPLANT
PACK BASIN DAY SURGERY FS (CUSTOM PROCEDURE TRAY) ×2 IMPLANT
PENCIL BUTTON HOLSTER BLD 10FT (ELECTRODE) ×2 IMPLANT
SPONGE GAUZE 4X4 12PLY (GAUZE/BANDAGES/DRESSINGS) ×2 IMPLANT
SPONGE LAP 4X18 X RAY DECT (DISPOSABLE) ×2 IMPLANT
STRIP CLOSURE SKIN 1/2X4 (GAUZE/BANDAGES/DRESSINGS) ×2 IMPLANT
SUT MON AB 4-0 PC3 18 (SUTURE) ×2 IMPLANT
SUT SILK 2 0 SH (SUTURE) IMPLANT
SUT VIC AB 2-0 SH 18 (SUTURE) IMPLANT
SUT VIC AB 3-0 SH 27 (SUTURE) ×2
SUT VIC AB 3-0 SH 27X BRD (SUTURE) ×1 IMPLANT
SYR BULB 3OZ (MISCELLANEOUS) ×2 IMPLANT
SYR CONTROL 10ML LL (SYRINGE) ×4 IMPLANT
TOWEL OR 17X24 6PK STRL BLUE (TOWEL DISPOSABLE) ×2 IMPLANT
TUBE CONNECTING 20X1/4 (TUBING) ×2 IMPLANT
YANKAUER SUCT BULB TIP NO VENT (SUCTIONS) ×2 IMPLANT

## 2013-02-23 NOTE — Interval H&P Note (Signed)
History and Physical Interval Note: no change in H and P  02/23/2013 12:55 PM  Michelle Petty  has presented today for surgery, with the diagnosis of left breast cancer   The various methods of treatment have been discussed with the patient and family. After consideration of risks, benefits and other options for treatment, the patient has consented to  Procedure(s): BREAST LUMPECTOMY WITH NEEDLE LOCALIZATION AND AXILLARY SENTINEL LYMPH NODE BX (Left) as a surgical intervention .  The patient's history has been reviewed, patient examined, no change in status, stable for surgery.  I have reviewed the patient's chart and labs.  Questions were answered to the patient's satisfaction.     [name]

## 2013-02-23 NOTE — Anesthesia Preprocedure Evaluation (Signed)
Anesthesia Evaluation  Patient identified by MRN, date of birth, ID band Patient awake    Reviewed: Allergy & Precautions, H&P , NPO status , Patient's Chart, lab work & pertinent test results  Airway Mallampati: I TM Distance: >3 FB Neck ROM: Full    Dental  (+) Teeth Intact and Dental Advisory Given   Pulmonary  breath sounds clear to auscultation        Cardiovascular Rhythm:Regular Rate:Normal     Neuro/Psych    GI/Hepatic   Endo/Other    Renal/GU      Musculoskeletal   Abdominal   Peds  Hematology   Anesthesia Other Findings   Reproductive/Obstetrics                           Anesthesia Physical Anesthesia Plan  ASA: I  Anesthesia Plan: General   Post-op Pain Management:    Induction: Intravenous  Airway Management Planned:   Additional Equipment:   Intra-op Plan:   Post-operative Plan: Extubation in OR  Informed Consent: I have reviewed the patients History and Physical, chart, labs and discussed the procedure including the risks, benefits and alternatives for the proposed anesthesia with the patient or authorized representative who has indicated his/her understanding and acceptance.   Dental advisory given  Plan Discussed with: Anesthesiologist and Surgeon  Anesthesia Plan Comments:         Anesthesia Quick Evaluation

## 2013-02-23 NOTE — Progress Notes (Signed)
MAKHAYLA MCMURRY [id number] 1965/11/14 47 y.o. 02/23/2013 10:03 PM  CC  Brock Bad, MD [address] [place] [address] Dr. Abigail Miyamoto Dr. 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 Abigail Miyamoto  HISTORY OF PRESENT ILLNESS:  DIM MEISINGER is a 47 y.o. female.  Without significant past medical history who recently had a screening mammogram performed on 01/07/2013 which was felt to have a mass in the right breast and calcifications within the left breast. On 01/21/2013 she had additional views performed that did show subareolar left breast calcifications. Biopsy was diagnostic for invasive ductal carcinoma with ductal carcinoma in situ. Tumor was ER +100% PR +60% with a proliferation marker Ki-67 11%. On 01/31/2013 MRI of the breasts were performed which showed 2.5 x 2.8 x 2.2 cm hematoma along the lower inner periareolar region at the biopsy site. Lymph nodes appeared normal. Patient is without any complaints. Her case was discussed at the multidisciplinary breast conference. Pathology and radiology were reviewed.   Past Medical History: Past Medical History  Diagnosis Date  . Breast cancer     Past Surgical History: Past Surgical History  Procedure Laterality Date  . Achilles tendon repair Left 1985  . Breast biopsy Left 01/21/13    malignant   . Cesarean section  2001  . Wisdom tooth extraction      Family History: Family History  Problem Relation Age of Onset  . Diabetes Mother   . Diabetes Maternal Grandmother     Social History History  Substance Use Topics  . Smoking status: Never Smoker   .  Smokeless tobacco: Never Used  . Alcohol Use: No    Allergies: No Known Allergies  Current Medications: Current Outpatient Prescriptions  Medication Sig Dispense Refill  . HYDROcodone-acetaminophen (NORCO) 5-325 MG per tablet Take 1-2 tablets by mouth every 4 (four) hours as needed for pain.  30 tablet  1  . ibuprofen (ADVIL,MOTRIN) 600 MG tablet Take 600 mg by mouth every 6 (six) hours as needed for pain.      . medroxyPROGESTERone (PROVERA) 10 MG tablet Take 1 tablet (10 mg total) by mouth daily.  10 tablet  2   No current facility-administered medications for this visit.    OB/GYN History:menarche at age 23 patient is premenopausal last menstrual period was on 01/26/2013. First live birth at 3.  Fertility Discussion:patient has completed her family Prior History of Cancer:no  Health Maintenance:  Colonoscopyno Bone Densityno Last PAP smearup-to-date  ECOG PERFORMANCE STATUS: 0 - Asymptomatic  Genetic Counseling/testing:yes patient will be referred  REVIEW OF SYSTEMS:  A comprehensive review of systems was negative.  PHYSICAL EXAMINATION: Blood pressure 128/83, pulse 71, temperature 98.4 F (36.9 C), temperature source Oral, resp. rate 20, height 5\' 8"  (1.727 m), weight 150 lb 11.2 oz (68.357 kg), last menstrual period 01/01/2013. Well-developed well-nourished female in no acute distress HEENT exam EOMI PERRLA sclerae anicteric no conjunctival pallor oral mucosa is moist neck is supple lungs are clear cardiovascular regular rate rhythm no murmurs gallops or rubs abdomen is soft nontender no HSM extremities no edema neuro patient's alert oriented otherwise nonfocal breast examination  no palpable masses nipple discharge.      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  BILATERAL BREAST MRI WITH AND WITHOUT CONTRAST  Technique: Multiplanar,  multisequence MR images of both breasts were obtained prior to and following the intravenous administration of 14 ml of Multihance.  THREE-DIMENSIONAL MR IMAGE RENDERING ON INDEPENDENT WORKSTATION:  Three-dimensional MR images were rendered by post-processing of the original MR data on an independent workstation.  The three- dimensional MR images were interpreted, and findings are reported in the following complete MRI report for this study.  Comparison:  Recent imaging examinations.  No prior MRI.  FINDINGS:  Breast composition:  c:  Heterogeneous fibroglandular tissue  Background parenchymal enhancement: Moderate.  Right breast:  No abnormal enhancement.  Multiple cysts, the largest approximating 1.7 cm in the inner right breast, posterior third.  Left breast:  Approximate 2.5 x 2.8 x 2.2 cm hematoma in the lower inner periareolar left breast at the biopsy site.  Blooming artifact from the tissue marker clip is within the hematoma. Minimal surrounding enhancement is felt to be related to post biopsy change.  No abnormal enhancement elsewhere in the left breast.  Multiple small cysts.  Lymph nodes:  Normal bilateral axillary lymph nodes.  No pathologic lymphadenopathy.  Ancillary findings:  None.  IMPRESSION:  1.  Hematoma at the biopsy site in the lower inner periareolar left breast. 2.  No abnormal enhancement in either breast to suggest malignancy. 3.  No pathologic lymphadenopathy.  RECOMMENDATION: Treatment plan.   Original Report Authenticated By: Hulan Saas, M.D.   Nm Sentinel Node Inj-no Rpt (breast)  02/23/2013   CLINICAL DATA: left breast cancer   Sulfur colloid was injected intradermally by the nuclear medicine  technologist for breast cancer sentinel node localization.    Mm Lt Plc Breast Loc Dev   1st Lesion  Inc Mammo Guide  02/23/2013   *RADIOLOGY REPORT*  Clinical Data: Known left breast invasive ductal and DCIS.  NEEDLE LOCALIZATION WITH MAMMOGRAPHIC GUIDANCE AND SPECIMEN RADIOGRAPH   Comparison:  Previous exam(s).  Patient presents for needle localization prior to surgery. I met with the patient and we discussed the procedure of needle localization including benefits and alternatives. We discussed the high likelihood of a successful procedure. We discussed the risks of the procedure, including infection, bleeding, tissue injury, and further surgery. Informed, written consent was given. The usual time-out protocol was performed immediately prior to the procedure.  Using mammographic guidance, sterile technique, 2% lidocaine and a #5 modified Kopans needle the clip in the medial aspect of the left breast was localized using a medial approach.  The films are marked for Dr. Magnus Ivan. Specimen radiograph was performed at [facility] and confirms the clip is present in the tissue sample.  The specimen is marked for pathology.  IMPRESSION: Needle localization of the left breast.  No apparent complications.   Original Report Authenticated By: Baird Lyons, M.D.     LABS:    Chemistry      Component Value Date/Time   NA 139 02/02/2013 1209   NA 139 01/06/2013 1433   K 3.8 02/02/2013 1209   K 4.3 01/06/2013 1433   CL 104 01/06/2013 1433   CO2 27 02/02/2013 1209   CO2 27 01/06/2013 1433   BUN 7.4 02/02/2013 1209   BUN 7 01/06/2013 1433   CREATININE 0.8 02/02/2013 1209  CREATININE 0.86 01/06/2013 1433      Component Value Date/Time   CALCIUM 9.0 02/02/2013 1209   CALCIUM 9.1 01/06/2013 1433   ALKPHOS 53 02/02/2013 1209   ALKPHOS 53 01/06/2013 1433   AST 12 02/02/2013 1209   AST 16 01/06/2013 1433   ALT 9 02/02/2013 1209   ALT 10 01/06/2013 1433   BILITOT 0.37 02/02/2013 1209   BILITOT 0.4 01/06/2013 1433      Lab Results  Component Value Date   WBC 6.5 02/02/2013   HGB 12.2 02/02/2013   HCT 35.7 02/02/2013   MCV 84.2 02/02/2013   PLT 243 02/02/2013   PATHOLOGY: ADDITIONAL INFORMATION: PROGNOSTIC INDICATORS - ACIS Results: IMMUNOHISTOCHEMICAL AND MORPHOMETRIC ANALYSIS BY THE AUTOMATED CELLULAR IMAGING  SYSTEM (ACIS) Estrogen Receptor: 100%, POSITIVE, STRONG STAINING INTENSITY Progesterone Receptor: 60%, POSITIVE, STRONG STAINING INTENSITY Proliferation Marker Ki67: 11% REFERENCE RANGE ESTROGEN RECEPTOR NEGATIVE <1% POSITIVE =>1% PROGESTERONE RECEPTOR NEGATIVE <1% POSITIVE =>1% All controls stained appropriately OF NOTE, THERE IS MINIMAL INVASIVE TUMOR PRESENT FOR EVALUATION. Pecola Leisure MD Pathologist, Electronic Signature ( Signed 01/31/2013) CHROMOGENIC IN-SITU HYBRIDIZATION Results: HER-2/NEU BY CISH - NO AMPLIFICATION OF HER-2 DETECTED. RESULT RATIO OF HER2: CEP 17 SIGNALS 1.27 AVERAGE HER2 COPY NUMBER PER CELL 1.65 1 of 3 Duplicate copy FINAL for Weichel, Loan ([id number]) ADDITIONAL INFORMATION:(continued) REFERENCE RANGE NEGATIVE HER2/Chr17 Ratio <2.0 and Average HER2 copy number <4.0 EQUIVOCAL HER2/Chr17 Ratio <2.0 and Average HER2 copy number 4.0 and <6.0 POSITIVE HER2/Chr17 Ratio >=2.0 and/or Average HER2 copy number >=6.0 Pecola Leisure MD Pathologist, Electronic Signature ( Signed 01/27/2013) FINAL DIAGNOSIS Diagnosis Breast, left, needle core biopsy, medial subareolar - INVASIVE DUCTAL CARCINOMA. - DUCTAL CARCINOMA IN SITU. - SEE COMMENT. Microscopic Comment The carcinoma appears grade I. Most of the carcinoma is in situ, as highlighted by calponin, smooth muscle myosin, and p63 immunohistochemical stains. A complete profile will be attempted on the invasive component and the results reported separately. The results were called to [facility] on 01/25/13. (JBK:caf/gt 01/24/13) Pecola Leisure MD Pathologist, Electronic Signature (Case signed 01/25/2013) S ASSESSMENT    47 year old female with  #1 new diagnosis of invasive ductal carcinoma with DCIS of the left breast. She underwent a needle core biopsy of the medial subareolar lesion. The tumor is ER positive PR positive HER-2/neu negative with a low Ki-67. She is an excellent candidate  for lumpectomy with sentinel lymph node biopsy and external beam radiation therapy. I do not feel that she will need  Chemotherapy however I would like to see what her final pathology reveals. We will also plan on sending an Oncotype testing on her to see what her final breast cancer recurrence score is to determine whether or not she should be treated with antiestrogen therapy alone versus chemotherapy followed by antiestrogen therapy.  #2 patient and I discussed her diagnosis as well as her treatment options today.  Clinical Trial Eligibility:no Multidisciplinary conference discussion yes     PLAN:    #1 patient will proceed with lumpectomy and sentinel lymph node biopsy.  #2 I will plan on seeing her back after her surgery. We will also send an Oncotype DX testing on her final pathology.        Discussion: Patient is being treated per NCCN breast cancer care guidelines appropriate for stage.I   Thank you so much for allowing me to participate in the care of ANDREAL VULTAGGIO. I will continue to follow up the patient with you and assist in her care.  All  questions were answered. The patient knows to call the clinic with any problems, questions or concerns. We can certainly see the patient much sooner if necessary.  I spent 40 minutes counseling the patient face to face. The total time spent in the appointment was 55 minutes.  Drue Second, MD Medical/Oncology [facility] [phone] (beeper) [phone] (Office)

## 2013-02-23 NOTE — Transfer of Care (Signed)
Immediate Anesthesia Transfer of Care Note  Patient: Michelle Petty  Procedure(s) Performed: Procedure(s): BREAST LUMPECTOMY WITH NEEDLE LOCALIZATION AND AXILLARY SENTINEL LYMPH NODE BX (Left)  Patient Location: PACU  Anesthesia Type:General  Level of Consciousness: awake and patient cooperative  Airway & Oxygen Therapy: Patient Spontanous Breathing and Patient connected to face mask oxygen  Post-op Assessment: Report given to PACU RN and Post -op Vital signs reviewed and stable  Post vital signs: Reviewed and stable  Complications: No apparent anesthesia complications

## 2013-02-23 NOTE — Anesthesia Postprocedure Evaluation (Signed)
  Anesthesia Post-op Note  Patient: Michelle Petty  Procedure(s) Performed: Procedure(s): BREAST LUMPECTOMY WITH NEEDLE LOCALIZATION AND AXILLARY SENTINEL LYMPH NODE BX (Left)  Patient Location: PACU  Anesthesia Type:General  Level of Consciousness: awake, alert  and oriented  Airway and Oxygen Therapy: Patient Spontanous Breathing  Post-op Pain: mild  Post-op Assessment: Post-op Vital signs reviewed  Post-op Vital Signs: Reviewed  Complications: No apparent anesthesia complications

## 2013-02-23 NOTE — Anesthesia Procedure Notes (Signed)
Procedure Name: LMA Insertion Date/Time: 02/23/2013 1:23 PM Performed by: Amelio Brosky D Pre-anesthesia Checklist: Patient identified, Emergency Drugs available, Suction available and Patient being monitored Patient Re-evaluated:Patient Re-evaluated prior to inductionOxygen Delivery Method: Circle System Utilized Preoxygenation: Pre-oxygenation with 100% oxygen Intubation Type: IV induction Ventilation: Mask ventilation without difficulty LMA: LMA inserted LMA Size: 4.0 Number of attempts: 1 Airway Equipment and Method: bite block Placement Confirmation: positive ETCO2 Tube secured with: Tape Dental Injury: Teeth and Oropharynx as per pre-operative assessment

## 2013-02-23 NOTE — Op Note (Signed)
BREAST LUMPECTOMY WITH NEEDLE LOCALIZATION AND AXILLARY SENTINEL LYMPH NODE BX  Procedure Note  Michelle Petty 02/23/2013   Pre-op Diagnosis: left breast cancer      Post-op Diagnosis: same  Procedure(s): LEFT BREAST PARTIAL MASTECTOMY WITH NEEDLE LOCALIZATION AND AXILLARY SENTINEL LYMPH NODE BX INJECTION OF BLUE DYE  Surgeon(s): Shelly Rubenstein, MD  Anesthesia: General  Staff:  Circulator: Raliegh Scarlet, RN Scrub Person: Hurshel Party, CST  Estimated Blood Loss: Minimal               Specimens: sent to path          [facility] A   Date: 02/23/2013  Time: 2:16 PM

## 2013-02-24 ENCOUNTER — Encounter (HOSPITAL_BASED_OUTPATIENT_CLINIC_OR_DEPARTMENT_OTHER): Payer: Self-pay | Admitting: Surgery

## 2013-02-24 ENCOUNTER — Telehealth (INDEPENDENT_AMBULATORY_CARE_PROVIDER_SITE_OTHER): Payer: Self-pay | Admitting: General Surgery

## 2013-02-24 NOTE — Telephone Encounter (Signed)
LMOM letting pt know her first PO appt will be on 03/14/13 at 11:40 w/ arrival time of 11:25.

## 2013-02-24 NOTE — Op Note (Signed)
Michelle Petty, Michelle Petty               ACCOUNT NO.:  [id number]  MEDICAL RECORD NO.:  [id number]  LOCATION:  [facility]                          FACILITY:  [facility]  PHYSICIAN:  Abigail Miyamoto, M.D. DATE OF BIRTH:  Mar 16, 1966  DATE OF PROCEDURE:  02/23/2013 DATE OF DISCHARGE:                              OPERATIVE REPORT   PREOPERATIVE DIAGNOSIS:  Left breast cancer.  POSTOPERATIVE DIAGNOSIS:  Left breast cancer.  PROCEDURES: 1. Needle localized left breast partial mastectomy with left axillary     sentinel lymph node biopsy. 2. Injection of blue dye.  SURGEON:  Abigail Miyamoto, M.D.  ANESTHESIA:  General and 0.5% Marcaine.  ESTIMATED BLOOD LOSS:  Minimal.  INDICATIONS:  This is a 47 year old female, who recently found to have abnormal calcifications in the left breast.  Needle biopsy showed invasive cancer.  The decision made to proceed with partial mastectomy and sentinel node biopsy.  PROCEDURE IN DETAIL:  The patient was identified in the holding area. She already had a localization wire placed in the left breast and radioactive isotope was then injected into the left areolar area.  The patient then taken to the operating room, placed supine on the operating room table and general anesthesia was induced.  She was again identified as the correct patient.  Blue dye was injected underneath the left areola and the breast was massaged.  Her left breast and axilla were then prepped and draped in usual sterile fashion.  I anesthetized the skin around the medial edge of the areola with Marcaine and made a circumareolar incision with a scalpel.  I took this down to the breast tissue with electrocautery.  I then tunneled slightly laterally and was able to pull a localization wire through the skin into the incision.  I then performed a wide partial mastectomy going down to the chest wall, and slightly underneath the areola.  It appeared to have wide margins around localization wire.  Once  the specimen was completely removed, it was x-rayed and Radiology confirmed the suspicious area was in the center of the specimen.  I then marked the specimen with a marker paint prior to this and it was sent to pathology for evaluation.  I achieved hemostasis with the cautery.  I then brought the Neoprobe on the field and identified an area of increased uptake in the left axilla.  I anesthetized the skin with Marcaine.  I made a small incision with a scalpel.  I then dissected down to the axillary tissue.  I identified 2 nodes which were both hot and blue with the Neoprobe and excised and sent to pathology for evaluation.  There was a 3rd node that had uptake of dye but no uptake of radioisotope then sent as well.  I then examined the axilla and found no other large lymph nodes.  I then anesthetized both wounds further with Marcaine.  I irrigated with saline.  I placed surgical clips in the biopsy cavity for identification purposes.  I then closed both incisions with interrupted 3-0 Vicryl sutures and running 4- 0 Monocryl sutures.  Steri-Strips, gauze, and Tegaderm were then applied.  The patient tolerated the procedure well. All counts were correct at the  end of the procedure.  The patient was then extubated in the operating room and taken in a stable condition to the recovery room.     Abigail Miyamoto, M.D.     DB/MEDQ  D:  02/23/2013  T:  02/24/2013  Job:  [id number]

## 2013-03-04 ENCOUNTER — Ambulatory Visit (HOSPITAL_BASED_OUTPATIENT_CLINIC_OR_DEPARTMENT_OTHER): Payer: Medicaid Other | Admitting: Oncology

## 2013-03-04 ENCOUNTER — Encounter: Payer: Self-pay | Admitting: Oncology

## 2013-03-04 ENCOUNTER — Telehealth: Payer: Self-pay | Admitting: *Deleted

## 2013-03-04 VITALS — BP 114/77 | HR 75 | Temp 98.4°F | Resp 18 | Ht 68.0 in | Wt 152.7 lb

## 2013-03-04 DIAGNOSIS — C50119 Malignant neoplasm of central portion of unspecified female breast: Secondary | ICD-10-CM

## 2013-03-04 DIAGNOSIS — C50312 Malignant neoplasm of lower-inner quadrant of left female breast: Secondary | ICD-10-CM

## 2013-03-04 DIAGNOSIS — Z170 Estrogen receptor positive status [ER+]: Secondary | ICD-10-CM

## 2013-03-04 NOTE — Patient Instructions (Addendum)
Refer back to RT  I will see you back after completion of Radiation therapy

## 2013-03-04 NOTE — Telephone Encounter (Signed)
appts made and printed...td 

## 2013-03-11 ENCOUNTER — Encounter: Payer: Self-pay | Admitting: Oncology

## 2013-03-11 NOTE — Progress Notes (Signed)
Location of Breast Cancer:left breast  Histology per Pathology Report: Diagnosis 1. Breast, lumpectomy, Left - DUCTAL CARCINOMA IN SITU, LOW GRADE, SPANNING AT LEAST 0.4 CM. - DUCTAL CARCINOMA IN SITU IS FOCALLY LESS THAN 0.1 CM TO THE LATERAL MARGIN. - SEE ONCOLOGY TABLE BELOW. 2. Lymph node, sentinel, biopsy, Left axillary #1 - THERE IS NO EVIDENCE OF CARCINOMA IN 1 OF 1 LYMPH NODE (0/1). 3. Lymph node, sentinel, biopsy, Left axillary #2 - THERE IS NO EVIDENCE OF CARCINOMA IN 1 OF 1 LYMPH NODE (0/1). 4. Lymph node, sentinel, biopsy, Left axillary #3 - THERE IS NO EVIDENCE OF CARCINOMA IN 1 OF 1 LYMPH NODE (0/1).  Receptor Status: ER(positive 100%), PR (positive 60%), Her2-neu (negative)  Did patient present with symptoms (if so, please note symptoms) or was this found on screening mammography?: screening mammography  Past/Anticipated interventions by surgeon, if any: Needle localized left breast partial mastectomy with left axillary sentinel lymph node biopsy by Dr. Magnus Ivan on 02/23/2013.  Past/Anticipated interventions by medical oncology, if any: None.  Sees Dr. Welton Flakes.  Next appt is 05/10/2013.  Lymphedema issues, if any:  Pain issues, if any:    SAFETY ISSUES:  Prior radiation? no  Pacemaker/ICD? no  Possible current pregnancy?no  Is the patient on methotrexate? no  Current Complaints / other details:

## 2013-03-14 ENCOUNTER — Ambulatory Visit (INDEPENDENT_AMBULATORY_CARE_PROVIDER_SITE_OTHER): Payer: Medicaid Other | Admitting: Surgery

## 2013-03-14 ENCOUNTER — Encounter (HOSPITAL_BASED_OUTPATIENT_CLINIC_OR_DEPARTMENT_OTHER): Payer: Self-pay | Admitting: *Deleted

## 2013-03-14 VITALS — BP 120/76 | HR 72 | Resp 14 | Ht 68.0 in | Wt 155.0 lb

## 2013-03-14 DIAGNOSIS — Z09 Encounter for follow-up examination after completed treatment for conditions other than malignant neoplasm: Secondary | ICD-10-CM

## 2013-03-14 NOTE — Progress Notes (Signed)
Subjective:     Patient ID: Michelle Petty, female   DOB: [birth year], 47 y.o.   MRN: [id number]  HPI She is here for first postop visit status post left breast lumpectomy and sentinel node biopsy. She is doing well and has no complaints  Review of Systems     Objective:   Physical Exam Her incisions are healing well. The final pathology showed only in situ disease and no further invasive cancer. Her lateral margin was negative but was close at 0.1 cm    Assessment:     Patient stable postop     Plan:     I am going to go ahead and schedule her for reexcision of the lateral margin. I discussed this with her in detail. She will be seeing Dr. Dayton Scrape tomorrow. We will proceed with surgery after that unless he decides he does not need further margins. I suspect, however, he will recommend further excision

## 2013-03-14 NOTE — Progress Notes (Signed)
Pt here for lump 02/23/13-did well-needs re-exc

## 2013-03-15 ENCOUNTER — Ambulatory Visit: Payer: Medicaid Other

## 2013-03-15 ENCOUNTER — Ambulatory Visit: Payer: Medicaid Other | Attending: Radiation Oncology | Admitting: Radiation Oncology

## 2013-03-17 NOTE — H&P (Signed)
Michelle Petty is an 47 y.o. female.   Chief Complaint: close left breast margin HPI: she is status post left breast lumpectomy for malignancy.  Her margins were negative but the lateral margin was <0.1cm so reexcision of this area is recommended.  She has no complaints.  Past Medical History  Diagnosis Date  . Breast cancer     left    Past Surgical History  Procedure Laterality Date  . Achilles tendon repair Left 1985  . Breast biopsy Left 01/21/13    malignant   . Cesarean section  2001  . Wisdom tooth extraction    . Breast lumpectomy with needle localization and axillary sentinel lymph node bx Left 02/23/2013    Procedure: BREAST LUMPECTOMY WITH NEEDLE LOCALIZATION AND AXILLARY SENTINEL LYMPH NODE BX;  Surgeon: Shelly Rubenstein, MD;  Location: [facility];  Service: General;  Laterality: Left;    Family History  Problem Relation Age of Onset  . Diabetes Mother   . Diabetes Maternal Grandmother    Social History:  reports that she has never smoked. She has never used smokeless tobacco. She reports that she does not drink alcohol or use illicit drugs.  Allergies: No Known Allergies  No prescriptions prior to admission    No results found for this or any previous visit (from the past 48 hour(s)). No results found.  Review of Systems  All other systems reviewed and are negative.    Height 5\' 8"  (1.727 m), weight 150 lb (68.04 kg), last menstrual period 02/21/2013. Physical Exam  Constitutional: She appears well-developed and well-nourished. No distress.  HENT:  Head: Normocephalic.  Eyes: Conjunctivae are normal.  Neck: Normal range of motion. Neck supple. No tracheal deviation present.  Cardiovascular: Normal rate, regular rhythm and normal heart sounds.   Respiratory: Breath sounds normal. No respiratory distress.  Musculoskeletal: Normal range of motion.  Neurological: She is alert.  Skin: Skin is warm and dry. No erythema.  Psychiatric: Her  behavior is normal. Judgment normal.   breast:  Left breast lumpectomy incision is healing well.  Assessment/Plan Close margin left breast s/p lumpectomy  Re-excision of the lateral margin of the left breast is recommended prior to radiation therapy.  The risks were discussed in detail including the need for further surgery.  She understands and wishes to proceed.  Valor Turberville A 03/17/2013, 9:00 PM

## 2013-03-18 ENCOUNTER — Encounter (HOSPITAL_BASED_OUTPATIENT_CLINIC_OR_DEPARTMENT_OTHER): Payer: Self-pay | Admitting: Anesthesiology

## 2013-03-18 ENCOUNTER — Ambulatory Visit (HOSPITAL_BASED_OUTPATIENT_CLINIC_OR_DEPARTMENT_OTHER): Payer: Medicaid Other | Admitting: Anesthesiology

## 2013-03-18 ENCOUNTER — Encounter (HOSPITAL_BASED_OUTPATIENT_CLINIC_OR_DEPARTMENT_OTHER)
Admission: RE | Disposition: A | Discharge status: Home / Self Care | Payer: Self-pay | Source: Ambulatory Visit | Attending: Surgery

## 2013-03-18 ENCOUNTER — Ambulatory Visit (HOSPITAL_BASED_OUTPATIENT_CLINIC_OR_DEPARTMENT_OTHER)
Admission: RE | Admit: 2013-03-18 | Discharge: 2013-03-18 | Disposition: A | Discharge status: Home / Self Care | Payer: Medicaid Other | Source: Ambulatory Visit | Attending: Surgery | Admitting: Surgery

## 2013-03-18 DIAGNOSIS — C50919 Malignant neoplasm of unspecified site of unspecified female breast: Secondary | ICD-10-CM

## 2013-03-18 HISTORY — PX: EXCISION OF BREAST BIOPSY: SHX5822

## 2013-03-18 LAB — POCT HEMOGLOBIN-HEMACUE: Hemoglobin: 11.7 g/dL — ABNORMAL LOW (ref 12.0–15.0)

## 2013-03-18 SURGERY — EXCISION OF BREAST BIOPSY
Anesthesia: General | Site: Breast | Laterality: Left | Wound class: Clean

## 2013-03-18 MED ORDER — DEXAMETHASONE SODIUM PHOSPHATE 4 MG/ML IJ SOLN
INTRAMUSCULAR | Status: DC | PRN
  Administered 2013-03-18: 10 mg via INTRAVENOUS

## 2013-03-18 MED ORDER — ONDANSETRON HCL 4 MG/2ML IJ SOLN
INTRAMUSCULAR | Status: DC | PRN
  Administered 2013-03-18: 4 mg via INTRAVENOUS

## 2013-03-18 MED ORDER — BUPIVACAINE-EPINEPHRINE 0.5% -1:200000 IJ SOLN
INTRAMUSCULAR | Status: DC | PRN
  Administered 2013-03-18: 18 mL

## 2013-03-18 MED ORDER — MIDAZOLAM HCL 5 MG/5ML IJ SOLN
INTRAMUSCULAR | Status: DC | PRN
  Administered 2013-03-18: 2 mg via INTRAVENOUS

## 2013-03-18 MED ORDER — OXYCODONE HCL 5 MG/5ML PO SOLN: 5.0000 mg | Freq: Once | ORAL | Status: AC | PRN

## 2013-03-18 MED ORDER — CEFAZOLIN SODIUM-DEXTROSE 2-3 GM-% IV SOLR
2.0000 g | INTRAVENOUS | Status: AC
Start: 2013-03-18 — End: 2013-03-18
  Administered 2013-03-18: 2 g via INTRAVENOUS

## 2013-03-18 MED ORDER — OXYCODONE HCL 5 MG PO TABS
5.0000 mg | ORAL_TABLET | Freq: Once | ORAL | Status: AC | PRN
  Administered 2013-03-18: 5 mg via ORAL

## 2013-03-18 MED ORDER — LIDOCAINE HCL (CARDIAC) 20 MG/ML IV SOLN
INTRAVENOUS | Status: DC | PRN
  Administered 2013-03-18: 100 mg via INTRAVENOUS

## 2013-03-18 MED ORDER — LACTATED RINGERS IV SOLN
INTRAVENOUS | Status: DC
  Administered 2013-03-18: 20 mL/h via INTRAVENOUS
  Administered 2013-03-18: 10:00:00 via INTRAVENOUS

## 2013-03-18 MED ORDER — KETOROLAC TROMETHAMINE 30 MG/ML IJ SOLN
INTRAMUSCULAR | Status: DC | PRN
  Administered 2013-03-18: 30 mg via INTRAVENOUS

## 2013-03-18 MED ORDER — HYDROCODONE-ACETAMINOPHEN 5-325 MG PO TABS: 1.0000 | ORAL_TABLET | ORAL | Status: DC | PRN

## 2013-03-18 MED ORDER — PROPOFOL 10 MG/ML IV BOLUS
INTRAVENOUS | Status: DC | PRN
  Administered 2013-03-18: 180 mg via INTRAVENOUS

## 2013-03-18 MED ORDER — HYDROMORPHONE HCL PF 1 MG/ML IJ SOLN: 0.2500 mg | INTRAMUSCULAR | Status: DC | PRN

## 2013-03-18 MED ORDER — FENTANYL CITRATE 0.05 MG/ML IJ SOLN
INTRAMUSCULAR | Status: DC | PRN
  Administered 2013-03-18: 100 ug via INTRAVENOUS

## 2013-03-18 MED ORDER — PROMETHAZINE HCL 25 MG/ML IJ SOLN: 6.2500 mg | INTRAMUSCULAR | Status: DC | PRN

## 2013-03-18 SURGICAL SUPPLY — 43 items
APL SKNCLS STERI-STRIP NONHPOA (GAUZE/BANDAGES/DRESSINGS) ×1
BENZOIN TINCTURE PRP APPL 2/3 (GAUZE/BANDAGES/DRESSINGS) ×2 IMPLANT
BLADE HEX COATED 2.75 (ELECTRODE) ×2 IMPLANT
BLADE SURG 15 STRL LF DISP TIS (BLADE) ×1 IMPLANT
BLADE SURG 15 STRL SS (BLADE) ×2
CANISTER SUCTION 1200CC (MISCELLANEOUS) ×1 IMPLANT
CHLORAPREP W/TINT 26ML (MISCELLANEOUS) ×2 IMPLANT
CLIP TI WIDE RED SMALL 6 (CLIP) IMPLANT
CLOTH BEACON ORANGE TIMEOUT ST (SAFETY) ×2 IMPLANT
COVER MAYO STAND STRL (DRAPES) ×2 IMPLANT
COVER TABLE BACK 60X90 (DRAPES) ×2 IMPLANT
DECANTER SPIKE VIAL GLASS SM (MISCELLANEOUS) IMPLANT
DEVICE DUBIN W/COMP PLATE 8390 (MISCELLANEOUS) IMPLANT
DRAPE PED LAPAROTOMY (DRAPES) ×2 IMPLANT
DRAPE UTILITY XL STRL (DRAPES) ×2 IMPLANT
DRSG TEGADERM 4X4.75 (GAUZE/BANDAGES/DRESSINGS) ×2 IMPLANT
ELECT REM PT RETURN 9FT ADLT (ELECTROSURGICAL) ×2
ELECTRODE REM PT RTRN 9FT ADLT (ELECTROSURGICAL) ×1 IMPLANT
GAUZE SPONGE 4X4 12PLY STRL LF (GAUZE/BANDAGES/DRESSINGS) ×2 IMPLANT
GLOVE BIOGEL PI IND STRL 7.0 (GLOVE) IMPLANT
GLOVE BIOGEL PI INDICATOR 7.0 (GLOVE) ×1
GLOVE ECLIPSE 6.5 STRL STRAW (GLOVE) ×2 IMPLANT
GLOVE SURG SIGNA 7.5 PF LTX (GLOVE) ×2 IMPLANT
GOWN PREVENTION PLUS XLARGE (GOWN DISPOSABLE) ×2 IMPLANT
GOWN PREVENTION PLUS XXLARGE (GOWN DISPOSABLE) ×2 IMPLANT
KIT MARKER MARGIN INK (KITS) ×1 IMPLANT
NDL HYPO 25X1 1.5 SAFETY (NEEDLE) ×1 IMPLANT
NEEDLE HYPO 25X1 1.5 SAFETY (NEEDLE) ×2 IMPLANT
NS IRRIG 1000ML POUR BTL (IV SOLUTION) ×2 IMPLANT
PACK BASIN DAY SURGERY FS (CUSTOM PROCEDURE TRAY) ×2 IMPLANT
PENCIL BUTTON HOLSTER BLD 10FT (ELECTRODE) ×2 IMPLANT
SLEEVE SCD COMPRESS KNEE MED (MISCELLANEOUS) ×2 IMPLANT
SPONGE LAP 4X18 X RAY DECT (DISPOSABLE) ×2 IMPLANT
STRIP CLOSURE SKIN 1/2X4 (GAUZE/BANDAGES/DRESSINGS) ×2 IMPLANT
SUT MNCRL AB 4-0 PS2 18 (SUTURE) ×2 IMPLANT
SUT SILK 2 0 SH (SUTURE) ×2 IMPLANT
SUT VIC AB 3-0 SH 27 (SUTURE) ×2
SUT VIC AB 3-0 SH 27X BRD (SUTURE) ×1 IMPLANT
SYR CONTROL 10ML LL (SYRINGE) ×2 IMPLANT
TOWEL OR 17X24 6PK STRL BLUE (TOWEL DISPOSABLE) ×2 IMPLANT
TOWEL OR NON WOVEN STRL DISP B (DISPOSABLE) ×2 IMPLANT
TUBE CONNECTING 20X1/4 (TUBING) ×1 IMPLANT
YANKAUER SUCT BULB TIP NO VENT (SUCTIONS) IMPLANT

## 2013-03-18 NOTE — Anesthesia Procedure Notes (Signed)
Procedure Name: LMA Insertion Date/Time: 03/18/2013 9:47 AM Performed by: Gar Gibbon Pre-anesthesia Checklist: Patient identified, Emergency Drugs available, Suction available and Patient being monitored Patient Re-evaluated:Patient Re-evaluated prior to inductionOxygen Delivery Method: Circle System Utilized Preoxygenation: Pre-oxygenation with 100% oxygen Intubation Type: IV induction Ventilation: Mask ventilation without difficulty LMA: LMA inserted LMA Size: 4.0 Number of attempts: 1 Airway Equipment and Method: bite block Placement Confirmation: positive ETCO2 Tube secured with: Tape Dental Injury: Teeth and Oropharynx as per pre-operative assessment

## 2013-03-18 NOTE — Transfer of Care (Signed)
Immediate Anesthesia Transfer of Care Note  Patient: Michelle Petty  Procedure(s) Performed: Procedure(s): Re-Excision of Breast Cancer, Lateral Margin (Left)  Patient Location: PACU  Anesthesia Type:General  Level of Consciousness: sedated  Airway & Oxygen Therapy: Patient Spontanous Breathing and Patient connected to face mask oxygen  Post-op Assessment: Report given to PACU RN and Post -op Vital signs reviewed and stable  Post vital signs: Reviewed and stable  Complications: No apparent anesthesia complications

## 2013-03-18 NOTE — Anesthesia Postprocedure Evaluation (Signed)
Anesthesia Post Note  Patient: Michelle Petty  Procedure(s) Performed: Procedure(s) (LRB): Re-Excision of Breast Cancer, Lateral Margin (Left)  Anesthesia type: general  Patient location: PACU  Post pain: Pain level controlled  Post assessment: Patient's Cardiovascular Status Stable  Last Vitals:  Filed Vitals:   03/18/13 1145  BP: 120/76  Pulse: 59  Temp: 36.6 C  Resp: 16    Post vital signs: Reviewed and stable  Level of consciousness: sedated  Complications: No apparent anesthesia complications

## 2013-03-18 NOTE — Op Note (Signed)
NAMEBRIGHTON, DELIO               ACCOUNT NO.:  [id number]  MEDICAL RECORD NO.:  [id number]  LOCATION:                               FACILITY:  [facility]  PHYSICIAN:  Abigail Miyamoto, M.D. DATE OF BIRTH:  11-25-1965  DATE OF PROCEDURE:  03/18/2013 DATE OF DISCHARGE:  03/18/2013                              OPERATIVE REPORT   PREOPERATIVE DIAGNOSIS:  Left breast cancer status post lumpectomy with closed margins.  POSTOPERATIVE DIAGNOSIS:  Left breast cancer status post lumpectomy with closed margins.  PROCEDURE:  Re-excision of left breast cancer, lateral margin.  SURGEON:  Abigail Miyamoto, M.D.  ANESTHESIA:  General and 0.5% Marcaine.  ESTIMATED BLOOD LOSS:  Minimal.  INDICATIONS:  This is a 47 year old female who had undergone a left breast lumpectomy for left breast cancer with ductal carcinoma in situ as well.  The margins were negative, however, the lateral margin was closed, therefore decision made to exceed with re-excision of the lateral margin.  PROCEDURE IN DETAIL:  The patient was brought to the operating room, identified as Michelle Petty.  She was placed supine on the operating room table, and general anesthesia was induced.  Her left breast was then prepped and draped in usual sterile fashion.  I then anesthetized the skin around the previous incision with Marcaine.  I opened up the incision, which was on the medial edge of the areola with a scalpel.  I then evacuated the seroma from the cavity.  I then excised the margin with electrocautery on the lateral edge slowly undermining the areola nipple complex.  I took this all the way down to the chest wall.  The lower margin was then sent to Pathology for evaluation.  I achieved hemostasis with the cautery.  I anesthetized the wound further with Marcaine.  I then closed the subcutaneous tissue with interrupted 3-0 Vicryl sutures and closed the skin with a running 4-0 Monocryl.  Steri- Strips, gauze, and Tegaderm  were then applied.  The patient tolerated the procedure well.  All counts were correct at the end of the procedure.  The patient was then extubated in the operating room and taken in a stable condition to the recovery room.     Abigail Miyamoto, M.D.   ______________________________ Abigail Miyamoto, M.D.    DB/MEDQ  D:  03/18/2013  T:  03/18/2013  Job:  [id number]

## 2013-03-18 NOTE — Anesthesia Preprocedure Evaluation (Addendum)
Anesthesia Evaluation  Patient identified by MRN, date of birth, ID band Patient awake    Reviewed: Allergy & Precautions, H&P , NPO status , Patient's Chart, lab work & pertinent test results  History of Anesthesia Complications Negative for: history of anesthetic complications  Airway       Dental   Pulmonary neg pulmonary ROS,  breath sounds clear to auscultation        Cardiovascular negative cardio ROS  Rhythm:regular Rate:Normal     Neuro/Psych negative neurological ROS  negative psych ROS   GI/Hepatic negative GI ROS, Neg liver ROS,   Endo/Other  negative endocrine ROS  Renal/GU negative Renal ROS     Musculoskeletal   Abdominal   Peds  Hematology   Anesthesia Other Findings   Reproductive/Obstetrics negative OB ROS                         Anesthesia Physical Anesthesia Plan  ASA: I  Anesthesia Plan: General LMA   Post-op Pain Management:    Induction: Intravenous  Airway Management Planned: LMA  Additional Equipment:   Intra-op Plan:   Post-operative Plan: Extubation in OR  Informed Consent: I have reviewed the patients History and Physical, chart, labs and discussed the procedure including the risks, benefits and alternatives for the proposed anesthesia with the patient or authorized representative who has indicated his/her understanding and acceptance.   Dental advisory given  Plan Discussed with: CRNA, Anesthesiologist and Surgeon  Anesthesia Plan Comments:        Anesthesia Quick Evaluation

## 2013-03-18 NOTE — Op Note (Signed)
Re-Excision of Breast Cancer, Lateral Margin  Procedure Note  Michelle Petty 03/18/2013   Pre-op Diagnosis: close margin cancer left breast     Post-op Diagnosis: same  Procedure(s): Re-Excision of Breast Cancer, Lateral Margin  Surgeon(s): Shelly Rubenstein, MD  Anesthesia: General  Staff:  Circulator: Ronnell Freshwater, RN Scrub Person: Joie Bimler Neiers, CST  Estimated Blood Loss: Minimal               Specimens: sent to path          [facility]   Date: 03/18/2013  Time: 10:15 AM

## 2013-03-18 NOTE — Interval H&P Note (Signed)
History and Physical Interval Note: no change in H and P  03/18/2013 9:29 AM  Michelle Petty  has presented today for surgery, with the diagnosis of close margin cancer left breast  The various methods of treatment have been discussed with the patient and family. After consideration of risks, benefits and other options for treatment, the patient has consented to  Procedure(s): Re-Excision of Breast Cancer, Superior Margins (Left) as a surgical intervention .  The patient's history has been reviewed, patient examined, no change in status, stable for surgery.  I have reviewed the patient's chart and labs.  Questions were answered to the patient's satisfaction.     Ryelle Ruvalcaba A

## 2013-03-21 NOTE — Progress Notes (Signed)
OFFICE PROGRESS NOTE  CC**  [name],[name], MD [address] [place] [address] Dr. Abigail Miyamoto  Dr. 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 On 01/21/2013 she had additional views performed that did show subareolar left breast calcifications. Biopsy was diagnostic for invasive ductal carcinoma with ductal carcinoma in situ. Tumor was ER +100% PR +60% with a proliferation marker Ki-67 11%. On 01/31/2013 MRI of the breasts were performed which showed 2.5 x 2.8 x 2.2 cm hematoma along the lower inner periareolar region at the biopsy site. 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 But her lateral margin was negative but close at 0.1 cm. Therefore she is recommended reexcision of the lateral margin.  CURRENT THERAPY:reexcision of the margin  INTERVAL HISTORY: Michelle Petty 47 y.o. female returns for followup visit today. Overall she's doing well. She denies any fevers chills night sweats headaches shortness of breath chest pains or palpitations. We discussed her pathology in detail especially the lateral margin. I do think she needs a reexcision. She is gong to be seeing Dr. Dayton Scrape as well.  MEDICAL HISTORY: Past Medical History  Diagnosis Date  . Breast cancer     left    ALLERGIES:  has No Known Allergies.  MEDICATIONS:  Current Outpatient Prescriptions  Medication Sig Dispense Refill  . HYDROcodone-acetaminophen (NORCO) 5-325 MG per  tablet Take 1-2 tablets by mouth every 4 (four) hours as needed for pain.  30 tablet  1  . ibuprofen (ADVIL,MOTRIN) 600 MG tablet Take 600 mg by mouth every 6 (six) hours as needed for pain.       No current facility-administered medications for this visit.    SURGICAL HISTORY:  Past Surgical History  Procedure Laterality Date  . Achilles tendon repair Left 1985  . Breast biopsy Left 01/21/13    malignant   . Cesarean section  2001  . Wisdom tooth extraction    . Breast lumpectomy with needle localization and axillary sentinel lymph node bx Left 02/23/2013    Procedure: BREAST LUMPECTOMY WITH NEEDLE LOCALIZATION AND AXILLARY SENTINEL LYMPH NODE BX;  Surgeon: Shelly Rubenstein, MD;  Location: [facility];  Service: General;  Laterality: Left;    REVIEW OF SYSTEMS:  Pertinent items are noted in HPI.   HEALTH MAINTENANCE:  PHYSICAL EXAMINATION: Blood pressure 114/77, pulse 75, temperature 98.4 F (36.9 C), temperature source Oral, resp. rate 18, height 5\' 8"  (1.727 m), weight 152 lb 11.2 oz (69.264 kg), last menstrual period 01/26/2013. Body mass index is 23.22 kg/(m^2). ECOG PERFORMANCE STATUS: 0 - Asymptomatic   General appearance: alert, cooperative and appears stated age Resp: clear to auscultation bilaterally Cardio: regular rate and rhythm GI: soft, non-tender; bowel sounds normal; no masses,  no organomegaly Extremities: extremities normal, atraumatic, no cyanosis or edema Neurologic: Grossly normal   LABORATORY DATA: Lab Results  Component Value Date   WBC 6.5 02/02/2013   HGB 11.7* 03/18/2013   HCT 35.7 02/02/2013   MCV  84.2 02/02/2013   PLT 243 02/02/2013      Chemistry      Component Value Date/Time   NA 139 02/02/2013 1209   NA 139 01/06/2013 1433   K 3.8 02/02/2013 1209   K 4.3 01/06/2013 1433   CL 104 01/06/2013 1433   CO2 27 02/02/2013 1209   CO2 27 01/06/2013 1433   BUN 7.4 02/02/2013 1209   BUN 7 01/06/2013 1433   CREATININE 0.8 02/02/2013 1209    CREATININE 0.86 01/06/2013 1433      Component Value Date/Time   CALCIUM 9.0 02/02/2013 1209   CALCIUM 9.1 01/06/2013 1433   ALKPHOS 53 02/02/2013 1209   ALKPHOS 53 01/06/2013 1433   AST 12 02/02/2013 1209   AST 16 01/06/2013 1433   ALT 9 02/02/2013 1209   ALT 10 01/06/2013 1433   BILITOT 0.37 02/02/2013 1209   BILITOT 0.4 01/06/2013 1433       RADIOGRAPHIC STUDIES:  Nm Sentinel Node Inj-no Rpt (breast)  02/23/2013   CLINICAL DATA: left breast cancer   Sulfur colloid was injected intradermally by the nuclear medicine  technologist for breast cancer sentinel node localization.    Mm Lt Plc Breast Loc Dev   1st Lesion  Inc Mammo Guide  02/23/2013   *RADIOLOGY REPORT*  Clinical Data: Known left breast invasive ductal and DCIS.  NEEDLE LOCALIZATION WITH MAMMOGRAPHIC GUIDANCE AND SPECIMEN RADIOGRAPH  Comparison:  Previous exam(s).  Patient presents for needle localization prior to surgery. I met with the patient and we discussed the procedure of needle localization including benefits and alternatives. We discussed the high likelihood of a successful procedure. We discussed the risks of the procedure, including infection, bleeding, tissue injury, and further surgery. Informed, written consent was given. The usual time-out protocol was performed immediately prior to the procedure.  Using mammographic guidance, sterile technique, 2% lidocaine and a #5 modified Kopans needle the clip in the medial aspect of the left breast was localized using a medial approach.  The films are marked for Dr. Magnus Ivan. Specimen radiograph was performed at [facility] and confirms the clip is present in the tissue sample.  The specimen is marked for pathology.  IMPRESSION: Needle localization of the left breast.  No apparent complications.   Original Report Authenticated By: Baird Lyons, M.D.    ASSESSMENT: 47 year old female with  #1 DCIS status post left breast lumpectomy and sentinel lymph node biopsy. Postoperatively she is doing  well. She had negative margins but close lateral margin 0.1 cm. She will proceed with reexcision.  #2 she will also proceed with seeing Dr. Dayton Scrape in consultation.  #3 from my perspective patient is a good candidate for adjuvant tamoxifen 20 mg daily but this will proceed after radiation.   PLAN:   #1 proceed with reexcision of the lateral margin.  #2 I will see her back after her radiation is completed   All questions were answered. The patient knows to call the clinic with any problems, questions or concerns. We can certainly see the patient much sooner if necessary.  I spent 25 minutes counseling the patient face to face. The total time spent in the appointment was 30 minutes.    Drue Second, MD Medical/Oncology [facility] [phone] (beeper) [phone] (Office)

## 2013-03-22 ENCOUNTER — Encounter (HOSPITAL_BASED_OUTPATIENT_CLINIC_OR_DEPARTMENT_OTHER): Payer: Self-pay | Admitting: Surgery

## 2013-04-01 ENCOUNTER — Encounter (INDEPENDENT_AMBULATORY_CARE_PROVIDER_SITE_OTHER): Payer: Self-pay | Admitting: Surgery

## 2013-04-01 ENCOUNTER — Ambulatory Visit (INDEPENDENT_AMBULATORY_CARE_PROVIDER_SITE_OTHER): Payer: Medicaid Other | Admitting: Surgery

## 2013-04-01 VITALS — BP 110/68 | HR 64 | Temp 98.2°F | Resp 14 | Ht 68.0 in | Wt 155.8 lb

## 2013-04-01 DIAGNOSIS — Z09 Encounter for follow-up examination after completed treatment for conditions other than malignant neoplasm: Secondary | ICD-10-CM

## 2013-04-01 NOTE — Progress Notes (Signed)
Subjective:     Patient ID: Michelle Petty, female   DOB: 1965-08-08, 47 y.o.   MRN: [id number]  HPI She is here for a postop visit status post reexcision of the margin of her left breast cancer. The original margin was negative but close. She is doing well and has no complaints  Review of Systems     Objective:   Physical Exam On exam, her incision is healing well.  The final pathology showed no residual cancer. There was an incidental intraductal papilloma    Assessment:     Patient stable postop     Plan:     She will be seen a radiation oncologist soon.  I will see her back in approximately one month

## 2013-04-13 ENCOUNTER — Encounter: Payer: Self-pay | Admitting: Radiation Oncology

## 2013-04-13 DIAGNOSIS — C50919 Malignant neoplasm of unspecified site of unspecified female breast: Secondary | ICD-10-CM | POA: Insufficient documentation

## 2013-04-13 NOTE — Progress Notes (Addendum)
Location of Breast Cancer: left, subareolar, lower inner  Histology per Pathology Report:  02/23/13 Breast, lumpectomy, Left - DUCTAL CARCINOMA IN SITU, LOW GRADE, SPANNING AT LEAST 0.4 CM. - DUCTAL CARCINOMA IN SITU IS FOCALLY LESS THAN 0.1 CM TO THE LATERAL MARGIN. - SEE ONCOLOGY TABLE BELOW. 2. Lymph node, sentinel, biopsy, Left axillary #1 - THERE IS NO EVIDENCE OF CARCINOMA IN 1 OF 1 LYMPH NODE (0/1). 3. Lymph node, sentinel, biopsy, Left axillary #2 - THERE IS NO EVIDENCE OF CARCINOMA IN 1 OF 1 LYMPH NODE (0/1). 4. Lymph node, sentinel, biopsy, Left axillary #3 - THERE IS NO EVIDENCE OF CARCINOMA IN 1 OF 1 LYMPH NODE (0/1).  03/18/13 Breast, excision, left, lateral margin - BENIGN BREAST PARENCHYMA WITH FIBROSIS, SCATTERED CHRONIC INFLAMMATION AND FAT NECROSIS. - FIBROCYSTIC CHANGES PRESENT. - INCIDENTAL INTRADUCTAL PAPILLOMA. - NO ATYPIA, HYPERPLASIA, OR MALIGNANCY IDENTIFIED.  Receptor Status: ER(100%), PR (60%), Her2-neu (-)  Did patient present with symptoms (if so, please note symptoms) or was this found on screening mammography?: screening mammogram  Past/Anticipated interventions by surgeon, if any: 02/23/13 left partial mastectomy , 03/18/13 re-excision of left breast cancer, lateral margin, s/p lumpectomy w/close margins  Past/Anticipated interventions by medical oncology, if any: Chemotherapy : adjuvant Tamoxifen after radiation  Lymphedema issues, if any:  no  Pain issues, if any:  no  SAFETY ISSUES:  Prior radiation? no  Pacemaker/ICD? no  Possible current pregnancy? no  Is the patient on methotrexate? no  Current Complaints / other details:  Single, works in Advertising copywriter, 2 daughters ages 71, 70. Pt slightly fatigued.    Glennie Hawk, RN 04/13/2013,2:25 PM

## 2013-04-14 ENCOUNTER — Ambulatory Visit
Admission: RE | Admit: 2013-04-14 | Discharge: 2013-04-14 | Disposition: A | Discharge status: Home / Self Care | Payer: Medicaid Other | Source: Ambulatory Visit | Attending: Radiation Oncology | Admitting: Radiation Oncology

## 2013-04-14 ENCOUNTER — Encounter: Payer: Self-pay | Admitting: Radiation Oncology

## 2013-04-14 VITALS — BP 116/72 | HR 71 | Temp 97.9°F | Resp 20 | Wt 155.1 lb

## 2013-04-14 DIAGNOSIS — C50312 Malignant neoplasm of lower-inner quadrant of left female breast: Secondary | ICD-10-CM

## 2013-04-14 DIAGNOSIS — D0590 Unspecified type of carcinoma in situ of unspecified breast: Secondary | ICD-10-CM | POA: Insufficient documentation

## 2013-04-14 NOTE — Progress Notes (Signed)
CC: Dr. Abigail Miyamoto, Dr. Drue Second  Followup note:  Diagnosis: Stage 0 (Tis N0 M0) low-grade DCIS of the left breast  History: The patient is a 47 year old female who is seen today for evaluation and scheduling of her left breast radiation therapy in the management of her DCIS of the left breast. At the time of a screening mammogram on 01/07/2013 at the [facility] she was felt to have possible masses in the right breast and calcifications within the left breast. Additional views at the [facility] on 01/21/2013 showed subareolar left breast calcifications a. Biopsy was diagnostic for invasive ductal carcinoma along with DCIS. Her invasive disease was ER positive at 100% and PR positive at 60% with a proliferation Ki-67 marker of 11%. Breast MR on 01/31/2013 showed a 2.5 x 2.8 x 2.2 cm hematoma along the lower inner periareolar region at the biopsy site. Lymph nodes appear to be normal. She underwent a left partial mastectomy and sentinel lymph node biopsy on 02/23/2013. She is found to have a 0.4 cm low-grade DCIS extending focally less than 0.1 cm to the lateral margin. 3 sentinel lymph nodes were free of metastatic disease. She underwent reexcision on 03/18/2013 and there was no evidence for residual DCIS. He was able to preserve the nipple areolar complex. As mentioned above, she was for receptor positive, and HER-2/neu was not amplified. Therefore, she was not considered to be a candidate for the B. 41 study. She is without complaints today. She did meet with Dr. Welton Flakes, and she is willing to undergo adjuvant tamoxifen following completion of radiation therapy.  Physical examination: Alert and oriented. Filed Vitals:   04/14/13 1018  BP: 116/72  Pulse: 71  Temp: 97.9 F (36.6 C)  Resp: 20   Head and neck examination: Grossly unremarkable. Nodes: Without palpable cervical, supraclavicular, or axillary lymphadenopathy. Chest: Lungs clear. Heart: Regular rate and rhythm. Breasts:  There is a medial periareolar wound of the left breast which is healing well. There is minimal architectural distortion. No masses are appreciated. Right breast without masses or lesions. Extremities: Without edema.  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  Plan: She is a candidate for breast preservation. With a negative reexcision, I do not see a need for a pre-radiation mammogram to confirm removal of all suspicious microcalcifications. A pathologic extent of her disease correlates well with her mammographic findings. We discussed the potential acute and late toxicities of radiation therapy. I do not favor hypo-fractionated radiation therapy in patients under age 75. Therefore, we will give her conventional fractionation, delivering 5000 cGy in 25 sessions. She may be a candidate for deep inspiration/breath-hold technology to avoid the cardiac silhouette. Consent is signed today.  30 minutes was spent face-to-face with the patient, primarily counseling the patient and coordinating her care.

## 2013-04-14 NOTE — Progress Notes (Signed)
Please see the Nurse Progress Note in the MD Initial Consult Encounter for this patient. 

## 2013-04-18 ENCOUNTER — Ambulatory Visit
Admission: RE | Admit: 2013-04-18 | Discharge: 2013-04-18 | Disposition: A | Discharge status: Home / Self Care | Payer: Medicaid Other | Source: Ambulatory Visit | Attending: Radiation Oncology | Admitting: Radiation Oncology

## 2013-04-18 DIAGNOSIS — Z510 Encounter for antineoplastic radiation therapy: Secondary | ICD-10-CM | POA: Insufficient documentation

## 2013-04-18 DIAGNOSIS — L819 Disorder of pigmentation, unspecified: Secondary | ICD-10-CM | POA: Insufficient documentation

## 2013-04-18 DIAGNOSIS — C50319 Malignant neoplasm of lower-inner quadrant of unspecified female breast: Secondary | ICD-10-CM | POA: Insufficient documentation

## 2013-04-18 DIAGNOSIS — C50312 Malignant neoplasm of lower-inner quadrant of left female breast: Secondary | ICD-10-CM

## 2013-04-18 NOTE — Progress Notes (Signed)
Complex simulation/treatment planning note: The patient was taken to the CT simulator and placed supine. She was placed on a custom breast board and a custom neck mold was constructed for immobilization. Her left breast field borders were marked with radiopaque wires. Her partial mastectomy scar was also marked along the periareolar region. She was then scanned free breathing and it was determined that a portion of the cardiac silhouette was within the tangent fields. She was then scanned with deep inspiration and breath-hold and there was considerable movement of a heart away from the tangent field. Her normal anatomy including her heart and lungs were contoured. I contoured her left breast tumor bed guided by her surgical clips. She was set up to medial and lateral left breast tangents. I'm prescribing 5000 cGy in 25 sessions utilizing 6 MV photons. She'll now undergo 3-D simulation for her deep inspiration/breath-hold planning with dose volume histograms.

## 2013-04-20 ENCOUNTER — Encounter: Payer: Self-pay | Admitting: Radiation Oncology

## 2013-04-20 NOTE — Progress Notes (Signed)
  Radiation Oncology         ([phone]) [phone] ________________________________  Name: Michelle Petty MRN: [id number]  Date: 04/20/2013  DOB: 1965/10/19  RESPIRATORY MOTION MANAGEMENT SIMULATION  NARRATIVE:  In order to account for effect of respiratory motion on target structures and other organs in the planning and delivery of radiotherapy, this patient underwent respiratory motion management simulation.  To accomplish this, when the patient was brought to the CT simulation planning suite, 4D respiratoy motion management CT images were obtained.  The CT images were loaded into the planning software.  Then, using a variety of tools including Cine, MIP, and standard views, the target volume and planning target volumes (PTV) were delineated.  Avoidance structures were contoured.  Treatment planning then occurred.  Dose volume histograms were generated and reviewed for each of the requested structure.  The resulting plan was carefully reviewed and approved today.  3-D simulation note: The patient underwent 3-D simulation for treatment to her left breast with deep inspiration/breath-hold. Dose volume histograms were obtained for the lungs and heart in addition to the target structures. We met our departmental guidelines. She is being treated with 2 sets of multileaf collimators. I'm prescribing 5000 cGy in 25 sessions utilizing 6 MV photons. I chose the 100% isodose curve to cover the planning target volume.

## 2013-04-25 ENCOUNTER — Ambulatory Visit
Admission: RE | Admit: 2013-04-25 | Discharge: 2013-04-25 | Disposition: A | Discharge status: Home / Self Care | Payer: Medicaid Other | Source: Ambulatory Visit | Attending: Radiation Oncology | Admitting: Radiation Oncology

## 2013-04-25 DIAGNOSIS — C50312 Malignant neoplasm of lower-inner quadrant of left female breast: Secondary | ICD-10-CM

## 2013-04-25 NOTE — Progress Notes (Signed)
Simulation verification note: The patient underwent similar to verification for treatment to her left breast with deep inspiration/breath-hold technology. Her isocenter is in good position and the multileaf collimators contoured the treatment volume appropriately.

## 2013-04-26 ENCOUNTER — Ambulatory Visit
Admission: RE | Admit: 2013-04-26 | Discharge: 2013-04-26 | Disposition: A | Discharge status: Home / Self Care | Payer: Medicaid Other | Source: Ambulatory Visit | Attending: Radiation Oncology | Admitting: Radiation Oncology

## 2013-04-26 DIAGNOSIS — C50312 Malignant neoplasm of lower-inner quadrant of left female breast: Secondary | ICD-10-CM

## 2013-04-26 MED ORDER — RADIAPLEXRX EX GEL
Freq: Once | CUTANEOUS | Status: AC
  Administered 2013-04-26: 17:00:00 via TOPICAL

## 2013-04-26 MED ORDER — ALRA NON-METALLIC DEODORANT (RAD-ONC)
1.0000 "application " | Freq: Once | TOPICAL | Status: AC
  Administered 2013-04-26: 1 via TOPICAL

## 2013-04-26 NOTE — Progress Notes (Signed)
Patient here for post sim educaiton.  She was given the Radiation Therapy and You book and discussed potential side effects like fatigue and skin changes.  She was given the skin care handout, Alra deoderant and radiaplex gel.  She was instructed to use the radiaplex gel twice a day, after treatment and at bedtime.  She was oriented to the clinic and educated about seeing Dr. Dayton Scrape on Mondays.  She was instructed to call nursing with any questions or concerns.

## 2013-04-27 ENCOUNTER — Ambulatory Visit
Admission: RE | Admit: 2013-04-27 | Discharge: 2013-04-27 | Disposition: A | Discharge status: Home / Self Care | Payer: Medicaid Other | Source: Ambulatory Visit | Attending: Radiation Oncology | Admitting: Radiation Oncology

## 2013-04-28 ENCOUNTER — Ambulatory Visit
Admission: RE | Admit: 2013-04-28 | Discharge: 2013-04-28 | Disposition: A | Discharge status: Home / Self Care | Payer: Medicaid Other | Source: Ambulatory Visit | Attending: Radiation Oncology | Admitting: Radiation Oncology

## 2013-04-29 ENCOUNTER — Ambulatory Visit (INDEPENDENT_AMBULATORY_CARE_PROVIDER_SITE_OTHER): Payer: Medicaid Other | Admitting: Surgery

## 2013-04-29 ENCOUNTER — Encounter (INDEPENDENT_AMBULATORY_CARE_PROVIDER_SITE_OTHER): Payer: Self-pay | Admitting: Surgery

## 2013-04-29 ENCOUNTER — Ambulatory Visit
Admission: RE | Admit: 2013-04-29 | Discharge: 2013-04-29 | Disposition: A | Discharge status: Home / Self Care | Payer: Medicaid Other | Source: Ambulatory Visit | Attending: Radiation Oncology | Admitting: Radiation Oncology

## 2013-04-29 VITALS — BP 110/70 | HR 70 | Resp 18 | Ht 68.0 in | Wt 156.0 lb

## 2013-04-29 DIAGNOSIS — Z09 Encounter for follow-up examination after completed treatment for conditions other than malignant neoplasm: Secondary | ICD-10-CM

## 2013-04-29 NOTE — Progress Notes (Signed)
Subjective:     Patient ID: Michelle Petty, female   DOB: 03-01-1966, 47 y.o.   MRN: [id number]  HPI She is back today for another postop visit. She just started her radiation therapy to the left breast 3 days ago. She has no complaints  Review of Systems     Objective:   Physical Exam On exam, the incision is well-healed but there still some elevation of the areola and scarring. There is no evidence of infection and there is no erythema    Assessment:     Patient stable postop history of left breast cancer now undergoing radiation therapy     Plan:     I will see her back in 6 months unless there are problems. She will continue her care at the cancer Center

## 2013-05-02 ENCOUNTER — Ambulatory Visit
Admission: RE | Admit: 2013-05-02 | Discharge: 2013-05-02 | Disposition: A | Discharge status: Home / Self Care | Payer: Medicaid Other | Source: Ambulatory Visit | Attending: Radiation Oncology | Admitting: Radiation Oncology

## 2013-05-02 DIAGNOSIS — C50312 Malignant neoplasm of lower-inner quadrant of left female breast: Secondary | ICD-10-CM

## 2013-05-02 DIAGNOSIS — C50912 Malignant neoplasm of unspecified site of left female breast: Secondary | ICD-10-CM

## 2013-05-02 NOTE — Progress Notes (Signed)
Weekly Management Note:  Site: Left breast Current Dose:  1000  cGy Projected Dose: 5000  cGy (no boost)  Narrative: The patient is seen today for routine under treatment assessment. CBCT/MVCT images/port films were reviewed. The chart was reviewed.   She is without complaints today. She uses Radioplex gel.  Physical Examination: There were no vitals filed for this visit..  Weight:  . No significant skin changes  Impression: Tolerating radiation therapy well.  Plan: Continue radiation therapy as planned.

## 2013-05-03 ENCOUNTER — Ambulatory Visit
Admission: RE | Admit: 2013-05-03 | Discharge: 2013-05-03 | Disposition: A | Discharge status: Home / Self Care | Payer: Medicaid Other | Source: Ambulatory Visit | Attending: Radiation Oncology | Admitting: Radiation Oncology

## 2013-05-04 ENCOUNTER — Ambulatory Visit
Admission: RE | Admit: 2013-05-04 | Discharge: 2013-05-04 | Disposition: A | Discharge status: Home / Self Care | Payer: Medicaid Other | Source: Ambulatory Visit | Attending: Radiation Oncology | Admitting: Radiation Oncology

## 2013-05-05 ENCOUNTER — Ambulatory Visit
Admission: RE | Admit: 2013-05-05 | Discharge: 2013-05-05 | Disposition: A | Discharge status: Home / Self Care | Payer: Medicaid Other | Source: Ambulatory Visit | Attending: Radiation Oncology | Admitting: Radiation Oncology

## 2013-05-06 ENCOUNTER — Ambulatory Visit
Admission: RE | Admit: 2013-05-06 | Discharge: 2013-05-06 | Disposition: A | Discharge status: Home / Self Care | Payer: Medicaid Other | Source: Ambulatory Visit | Attending: Radiation Oncology | Admitting: Radiation Oncology

## 2013-05-09 ENCOUNTER — Ambulatory Visit
Admission: RE | Admit: 2013-05-09 | Discharge: 2013-05-09 | Disposition: A | Discharge status: Home / Self Care | Payer: Medicaid Other | Source: Ambulatory Visit | Attending: Radiation Oncology | Admitting: Radiation Oncology

## 2013-05-09 DIAGNOSIS — C50312 Malignant neoplasm of lower-inner quadrant of left female breast: Secondary | ICD-10-CM

## 2013-05-09 NOTE — Progress Notes (Signed)
Michelle Petty here for weekly under treat visit.  She denies pain and fatigue.  The skin on her left breast is intact with hyperpigmentation.  She is using radiaplex gel twice a day.

## 2013-05-09 NOTE — Progress Notes (Signed)
Weekly Management Note:  Site: Left breast Current Dose:  2000  cGy Projected Dose: 5000  CGy (no boost)  Narrative: The patient is seen today for routine under treatment assessment. CBCT/MVCT images/port films were reviewed. The chart was reviewed.   She is without complaints today. She uses Radioplex gel.  Physical Examination: There were no vitals filed for this visit..  Weight:  . No significant skin changes.  Impression: Tolerating radiation therapy well.  Plan: Continue radiation therapy as planned.

## 2013-05-10 ENCOUNTER — Telehealth: Payer: Self-pay | Admitting: Oncology

## 2013-05-10 ENCOUNTER — Encounter: Payer: Self-pay | Admitting: Oncology

## 2013-05-10 ENCOUNTER — Ambulatory Visit
Admission: RE | Admit: 2013-05-10 | Discharge: 2013-05-10 | Disposition: A | Discharge status: Home / Self Care | Payer: Medicaid Other | Source: Ambulatory Visit | Attending: Radiation Oncology | Admitting: Radiation Oncology

## 2013-05-10 ENCOUNTER — Ambulatory Visit (HOSPITAL_BASED_OUTPATIENT_CLINIC_OR_DEPARTMENT_OTHER): Payer: Medicaid Other | Admitting: Oncology

## 2013-05-10 VITALS — BP 114/74 | HR 80 | Temp 98.4°F | Resp 20 | Ht 68.0 in | Wt 155.0 lb

## 2013-05-10 DIAGNOSIS — Z170 Estrogen receptor positive status [ER+]: Secondary | ICD-10-CM

## 2013-05-10 DIAGNOSIS — D0590 Unspecified type of carcinoma in situ of unspecified breast: Secondary | ICD-10-CM

## 2013-05-10 DIAGNOSIS — C50312 Malignant neoplasm of lower-inner quadrant of left female breast: Secondary | ICD-10-CM

## 2013-05-10 NOTE — Progress Notes (Signed)
OFFICE PROGRESS NOTE  CC**  [name],[name], MD [address] [place] [address] Dr. Abigail Miyamoto  Dr. 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 On 01/21/2013 she had additional views performed that did show subareolar left breast calcifications. Biopsy was diagnostic for invasive ductal carcinoma with ductal carcinoma in situ. Tumor was ER +100% PR +60% with a proliferation marker Ki-67 11%. On 01/31/2013 MRI of the breasts were performed which showed 2.5 x 2.8 x 2.2 cm hematoma along the lower inner periareolar region at the biopsy site. 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 But her lateral margin was negative but close at 0.1 cm. patient did undergo reexcision and has subsequently begun radiation therapy.  CURRENT THERAPY: Continue radiation therapy  INTERVAL HISTORY: Michelle Petty 47 y.o. female returns for followup visit today. Overall she is doing well. She began radiation therapy and she is tolerating it very nicely without any significant problems. She is having some tenderness and swelling in the left breast as well as some redness due to the radiation. She has some fatigue otherwise remainder of the 10 point review of systems is negative.  MEDICAL HISTORY: Past Medical History  Diagnosis Date  . Breast cancer 01/21/13    left    ALLERGIES:  has No Known Allergies.  MEDICATIONS:  Current Outpatient Prescriptions  Medication  Sig Dispense Refill  . hyaluronate sodium (RADIAPLEXRX) GEL Apply 1 application topically 2 (two) times daily.      Marland Kitchen ibuprofen (ADVIL,MOTRIN) 600 MG tablet Take 600 mg by mouth every 6 (six) hours as needed for pain.      . non-metallic deodorant Thornton Papas) MISC Apply topically daily as needed.       No current facility-administered medications for this visit.    SURGICAL HISTORY:  Past Surgical History  Procedure Laterality Date  . Achilles tendon repair Left 1985  . Breast biopsy Left 01/21/13    malignant   . Cesarean section  2001  . Wisdom tooth extraction    . Breast lumpectomy with needle localization and axillary sentinel lymph node bx Left 02/23/2013    Procedure: BREAST LUMPECTOMY WITH NEEDLE LOCALIZATION AND AXILLARY SENTINEL LYMPH NODE BX;  Surgeon: Shelly Rubenstein, MD;  Location: [facility];  Service: General;  Laterality: Left;  . Excision of breast biopsy Left 03/18/2013    Procedure: Re-Excision of Breast Cancer, Lateral Margin;  Surgeon: Shelly Rubenstein, MD;  Location: [facility];  Service: General;  Laterality: Left;    REVIEW OF SYSTEMS:  Pertinent items are noted in HPI.   HEALTH MAINTENANCE:  PHYSICAL EXAMINATION: Blood pressure 114/74, pulse 80, temperature 98.4 F (36.9 C), temperature source Oral, resp. rate 20, height 5\' 8"  (1.727 m), weight 155 lb (70.308 kg), last menstrual period 04/08/2013. Body mass index is 23.57 kg/(m^2). ECOG PERFORMANCE STATUS: 0 - Asymptomatic   General appearance: alert, cooperative and appears stated age Resp: clear to auscultation bilaterally Cardio: regular rate and  rhythm GI: soft, non-tender; bowel sounds normal; no masses,  no organomegaly Extremities: extremities normal, atraumatic, no cyanosis or edema Neurologic: Grossly normal   LABORATORY DATA: Lab Results  Component Value Date   WBC 6.5 02/02/2013   HGB 11.7* 03/18/2013   HCT 35.7 02/02/2013   MCV 84.2 02/02/2013   PLT 243 02/02/2013       Chemistry      Component Value Date/Time   NA 139 02/02/2013 1209   NA 139 01/06/2013 1433   K 3.8 02/02/2013 1209   K 4.3 01/06/2013 1433   CL 104 01/06/2013 1433   CO2 27 02/02/2013 1209   CO2 27 01/06/2013 1433   BUN 7.4 02/02/2013 1209   BUN 7 01/06/2013 1433   CREATININE 0.8 02/02/2013 1209   CREATININE 0.86 01/06/2013 1433      Component Value Date/Time   CALCIUM 9.0 02/02/2013 1209   CALCIUM 9.1 01/06/2013 1433   ALKPHOS 53 02/02/2013 1209   ALKPHOS 53 01/06/2013 1433   AST 12 02/02/2013 1209   AST 16 01/06/2013 1433   ALT 9 02/02/2013 1209   ALT 10 01/06/2013 1433   BILITOT 0.37 02/02/2013 1209   BILITOT 0.4 01/06/2013 1433       RADIOGRAPHIC STUDIES:  Nm Sentinel Node Inj-no Rpt (breast)  02/23/2013   CLINICAL DATA: left breast cancer   Sulfur colloid was injected intradermally by the nuclear medicine  technologist for breast cancer sentinel node localization.    Mm Lt Plc Breast Loc Dev   1st Lesion  Inc Mammo Guide  02/23/2013   *RADIOLOGY REPORT*  Clinical Data: Known left breast invasive ductal and DCIS.  NEEDLE LOCALIZATION WITH MAMMOGRAPHIC GUIDANCE AND SPECIMEN RADIOGRAPH  Comparison:  Previous exam(s).  Patient presents for needle localization prior to surgery. I met with the patient and we discussed the procedure of needle localization including benefits and alternatives. We discussed the high likelihood of a successful procedure. We discussed the risks of the procedure, including infection, bleeding, tissue injury, and further surgery. Informed, written consent was given. The usual time-out protocol was performed immediately prior to the procedure.  Using mammographic guidance, sterile technique, 2% lidocaine and a #5 modified Kopans needle the clip in the medial aspect of the left breast was localized using a medial approach.  The films are marked for Dr. Magnus Ivan. Specimen radiograph was performed at [facility] and confirms the clip is present in the tissue sample.  The specimen is  marked for pathology.  IMPRESSION: Needle localization of the left breast.  No apparent complications.   Original Report Authenticated By: Baird Lyons, M.D.    ASSESSMENT: 47 year old female with  #1 DCIS status post left breast lumpectomy and sentinel lymph node biopsy. Postoperatively she is doing well. She had negative margins but close lateral margin 0.1 cm. She will proceed with reexcision.  #2 she will also proceed with seeing Dr. Dayton Scrape in consultation.  #3 from my perspective patient is a good candidate for adjuvant tamoxifen 20 mg daily but this will proceed after radiation.   PLAN:   #1 continue radiation therapy.  #2 patient and I discussed use of tamoxifen after she has completed her radiation. We discussed side effects. Literature was given to her.  #3 I will plan on seeing the patient back on 05/30/2013 for followup and to begin tamoxifen.  All questions were answered. The patient knows to call the clinic with any problems, questions or concerns. We can certainly see the patient much sooner if necessary.  I spent 15 minutes counseling the patient face to face. The total time spent in the appointment was 20 minutes.    Drue Second, MD Medical/Oncology [facility] [phone] (beeper) [phone] (Office)

## 2013-05-10 NOTE — Patient Instructions (Signed)
Continue radiation therapy he will finish this on November 3.  I will plan on seeing you back on November 4 to begin tamoxifen 20 mg daily.  Tamoxifen oral tablet What is this medicine? TAMOXIFEN (ta MOX i fen) blocks the effects of estrogen. It is commonly used to treat breast cancer. It is also used to decrease the chance of breast cancer coming back in women who have received treatment for the disease. It may also help prevent breast cancer in women who have a high risk of developing breast cancer. This medicine may be used for other purposes; ask your health care provider or pharmacist if you have questions. What should I tell my health care provider before I take this medicine? They need to know if you have any of these conditions: -blood clots -blood disease -cataracts or impaired eyesight -endometriosis -high calcium levels -high cholesterol -irregular menstrual cycles -liver disease -stroke -uterine fibroids -an unusual or allergic reaction to tamoxifen, other medicines, foods, dyes, or preservatives -pregnant or trying to get pregnant -breast-feeding How should I use this medicine? Take this medicine by mouth with a glass of water. Follow the directions on the prescription label. You can take it with or without food. Take your medicine at regular intervals. Do not take your medicine more often than directed. Do not stop taking except on your doctor's advice. A special MedGuide will be given to you by the pharmacist with each prescription and refill. Be sure to read this information carefully each time. Talk to your pediatrician regarding the use of this medicine in children. While this drug may be prescribed for selected conditions, precautions do apply. Overdosage: If you think you have taken too much of this medicine contact a poison control center or emergency room at once. NOTE: This medicine is only for you. Do not share this medicine with others. What if I miss a dose? If  you miss a dose, take it as soon as you can. If it is almost time for your next dose, take only that dose. Do not take double or extra doses. What may interact with this medicine? -aminoglutethimide -bromocriptine -chemotherapy drugs -female hormones, like estrogens and birth control pills -letrozole -medroxyprogesterone -phenobarbital -rifampin -warfarin This list may not describe all possible interactions. Give your health care provider a list of all the medicines, herbs, non-prescription drugs, or dietary supplements you use. Also tell them if you smoke, drink alcohol, or use illegal drugs. Some items may interact with your medicine. What should I watch for while using this medicine? Visit your doctor or health care professional for regular checks on your progress. You will need regular pelvic exams, breast exams, and mammograms. If you are taking this medicine to reduce your risk of getting breast cancer, you should know that this medicine does not prevent all types of breast cancer. If breast cancer or other problems occur, there is no guarantee that it will be found at an early stage. Do not become pregnant while taking this medicine or for 2 months after stopping this medicine. Stop taking this medicine if you get pregnant or think you are pregnant and contact your doctor. This medicine may harm your unborn baby. Women who can possibly become pregnant should use birth control methods that do not use hormones during tamoxifen treatment and for 2 months after therapy has stopped. Talk with your health care provider for birth control advice. Do not breast feed while taking this medicine. What side effects may I notice from receiving this  medicine? Side effects that you should report to your doctor or health care professional as soon as possible: -changes in vision (blurred vision) -changes in your menstrual cycle -difficulty breathing or shortness of breath -difficulty walking or  talking -new breast lumps -numbness -pelvic pain or pressure -redness, blistering, peeling or loosening of the skin, including inside the mouth -skin rash or itching (hives) -sudden chest pain -swelling of lips, face, or tongue -swelling, pain or tenderness in your calf or leg -unusual bruising or bleeding -vaginal discharge that is bloody, brown, or rust -weakness -yellowing of the whites of the eyes or skin Side effects that usually do not require medical attention (report to your doctor or health care professional if they continue or are bothersome): -fatigue -hair loss, although uncommon and is usually mild -headache -hot flashes -impotence (in men) -nausea, vomiting (mild) -vaginal discharge (white or clear) This list may not describe all possible side effects. Call your doctor for medical advice about side effects. You may report side effects to FDA at 1-800-FDA-1088. Where should I keep my medicine? Keep out of the reach of children. Store at room temperature between 20 and 25 degrees C (68 and 77 degrees F). Protect from light. Keep container tightly closed. Throw away any unused medicine after the expiration date. NOTE: This sheet is a summary. It may not cover all possible information. If you have questions about this medicine, talk to your doctor, pharmacist, or health care provider.  2012, Elsevier/Gold Standard. (03/30/2008 12:01:56 PM)

## 2013-05-11 ENCOUNTER — Ambulatory Visit
Admission: RE | Admit: 2013-05-11 | Discharge: 2013-05-11 | Disposition: A | Discharge status: Home / Self Care | Payer: Medicaid Other | Source: Ambulatory Visit | Attending: Radiation Oncology | Admitting: Radiation Oncology

## 2013-05-12 ENCOUNTER — Ambulatory Visit
Admission: RE | Admit: 2013-05-12 | Discharge: 2013-05-12 | Disposition: A | Discharge status: Home / Self Care | Payer: Medicaid Other | Source: Ambulatory Visit | Attending: Radiation Oncology | Admitting: Radiation Oncology

## 2013-05-13 ENCOUNTER — Ambulatory Visit
Admission: RE | Admit: 2013-05-13 | Discharge: 2013-05-13 | Disposition: A | Discharge status: Home / Self Care | Payer: Medicaid Other | Source: Ambulatory Visit | Attending: Radiation Oncology | Admitting: Radiation Oncology

## 2013-05-16 ENCOUNTER — Ambulatory Visit: Payer: Medicaid Other | Admitting: Obstetrics

## 2013-05-16 ENCOUNTER — Ambulatory Visit
Admission: RE | Admit: 2013-05-16 | Discharge: 2013-05-16 | Disposition: A | Discharge status: Home / Self Care | Payer: Medicaid Other | Source: Ambulatory Visit | Attending: Radiation Oncology | Admitting: Radiation Oncology

## 2013-05-16 VITALS — BP 119/77 | HR 66 | Temp 98.3°F | Ht 68.0 in | Wt 154.0 lb

## 2013-05-16 DIAGNOSIS — C50312 Malignant neoplasm of lower-inner quadrant of left female breast: Secondary | ICD-10-CM

## 2013-05-16 NOTE — Progress Notes (Signed)
Michelle Petty here for weekly under treat visit.  She had had 15 fractions to her left breast.  She denies pain.  She has occasional fatigue.  The skin on her left breast is intact with hyperpigmentation.  She is using radiaplex gel twice a day.

## 2013-05-16 NOTE — Progress Notes (Signed)
Weekly Management Note:  Site: Left breast Current Dose:  3000  cGy Projected Dose: 5000  CGy (no boost)  Narrative: The patient is seen today for routine under treatment assessment. CBCT/MVCT images/port films were reviewed. The chart was reviewed.   She is without complaints today. She uses Radioplex gel.  Physical Examination:  Filed Vitals:   05/16/13 1653  BP: 119/77  Pulse: 66  Temp: 98.3 F (36.8 C)  .  Weight: 154 lb (69.854 kg). There is slight hyperpigmentation the skin along the left breast with no areas of desquamation.  Impression: Tolerating radiation therapy well.  Plan: Continue radiation therapy as planned.

## 2013-05-17 ENCOUNTER — Ambulatory Visit
Admission: RE | Admit: 2013-05-17 | Discharge: 2013-05-17 | Disposition: A | Discharge status: Home / Self Care | Payer: Medicaid Other | Source: Ambulatory Visit | Attending: Radiation Oncology | Admitting: Radiation Oncology

## 2013-05-18 ENCOUNTER — Ambulatory Visit
Admission: RE | Admit: 2013-05-18 | Discharge: 2013-05-18 | Disposition: A | Discharge status: Home / Self Care | Payer: Medicaid Other | Source: Ambulatory Visit | Attending: Radiation Oncology | Admitting: Radiation Oncology

## 2013-05-19 ENCOUNTER — Ambulatory Visit
Admission: RE | Admit: 2013-05-19 | Discharge: 2013-05-19 | Disposition: A | Discharge status: Home / Self Care | Payer: Medicaid Other | Source: Ambulatory Visit | Attending: Radiation Oncology | Admitting: Radiation Oncology

## 2013-05-20 ENCOUNTER — Ambulatory Visit
Admission: RE | Admit: 2013-05-20 | Discharge: 2013-05-20 | Disposition: A | Discharge status: Home / Self Care | Payer: Medicaid Other | Source: Ambulatory Visit | Attending: Radiation Oncology | Admitting: Radiation Oncology

## 2013-05-23 ENCOUNTER — Encounter: Payer: Self-pay | Admitting: Radiation Oncology

## 2013-05-23 ENCOUNTER — Ambulatory Visit
Admission: RE | Admit: 2013-05-23 | Discharge: 2013-05-23 | Disposition: A | Discharge status: Home / Self Care | Payer: Medicaid Other | Source: Ambulatory Visit | Attending: Radiation Oncology | Admitting: Radiation Oncology

## 2013-05-23 VITALS — BP 118/72 | HR 69 | Temp 98.5°F | Resp 20 | Wt 155.1 lb

## 2013-05-23 DIAGNOSIS — C50312 Malignant neoplasm of lower-inner quadrant of left female breast: Secondary | ICD-10-CM

## 2013-05-23 NOTE — Progress Notes (Signed)
Pt denies pain, fatigue, loss of appetite. She is applying Radiaplex to left breast treatment area for some darkening of skin.

## 2013-05-23 NOTE — Progress Notes (Signed)
Weekly Management Note:  Site: Left breast Current Dose:  4000  cGy Projected Dose: 5000  cGy  Narrative: The patient is seen today for routine under treatment assessment. CBCT/MVCT images/port films were reviewed. The chart was reviewed.   She is without complaints today except for discomfort when she sleeps on her left side.. She is using Radioplex gel.  Physical Examination:  Filed Vitals:   05/23/13 1558  BP: 118/72  Pulse: 69  Temp: 98.5 F (36.9 C)  Resp: 20  .  Weight: 155 lb 1.6 oz (70.353 kg). There is hyperpigmentation the skin along left breast with patchy dry desquamation along the inframammary region and lower axilla. No areas of moist desquamation.  Impression: Tolerating radiation therapy well.  Plan: Continue radiation therapy as planned.

## 2013-05-24 ENCOUNTER — Ambulatory Visit
Admission: RE | Admit: 2013-05-24 | Discharge: 2013-05-24 | Disposition: A | Discharge status: Home / Self Care | Payer: Medicaid Other | Source: Ambulatory Visit | Attending: Radiation Oncology | Admitting: Radiation Oncology

## 2013-05-25 ENCOUNTER — Ambulatory Visit
Admission: RE | Admit: 2013-05-25 | Discharge: 2013-05-25 | Disposition: A | Discharge status: Home / Self Care | Payer: Medicaid Other | Source: Ambulatory Visit | Attending: Radiation Oncology | Admitting: Radiation Oncology

## 2013-05-26 ENCOUNTER — Ambulatory Visit
Admission: RE | Admit: 2013-05-26 | Discharge: 2013-05-26 | Disposition: A | Discharge status: Home / Self Care | Payer: Medicaid Other | Source: Ambulatory Visit | Attending: Radiation Oncology | Admitting: Radiation Oncology

## 2013-05-27 ENCOUNTER — Ambulatory Visit
Admission: RE | Admit: 2013-05-27 | Discharge: 2013-05-27 | Disposition: A | Discharge status: Home / Self Care | Payer: Medicaid Other | Source: Ambulatory Visit | Attending: Radiation Oncology | Admitting: Radiation Oncology

## 2013-05-30 ENCOUNTER — Ambulatory Visit
Admission: RE | Admit: 2013-05-30 | Discharge: 2013-05-30 | Disposition: A | Discharge status: Home / Self Care | Payer: Medicaid Other | Source: Ambulatory Visit | Attending: Radiation Oncology | Admitting: Radiation Oncology

## 2013-05-30 ENCOUNTER — Encounter: Payer: Self-pay | Admitting: Radiation Oncology

## 2013-05-30 VITALS — BP 110/69 | HR 68 | Temp 98.2°F | Wt 155.7 lb

## 2013-05-30 DIAGNOSIS — C50312 Malignant neoplasm of lower-inner quadrant of left female breast: Secondary | ICD-10-CM

## 2013-05-30 MED ORDER — RADIAPLEXRX EX GEL
Freq: Once | CUTANEOUS | Status: AC
  Administered 2013-05-30: 17:00:00 via TOPICAL

## 2013-05-30 NOTE — Progress Notes (Signed)
Patient completes 25 radiation treatments to left breast.Hyperpigmentation with out peeling and follicular rash with minimum itching.To continue application of radiaplex, will give additional tube today.To follow up in one month.May call if any questions or concerns.Informed of skin care after completion of treatment.

## 2013-05-30 NOTE — Progress Notes (Signed)
[facility] Radiation Oncology End of Treatment Note  Name:Michelle Petty  Date: 05/30/2013 GEX:[id number] DOB:01/07/1966   Status:outpatient    CC: [name],[name], MD  Dr. Abigail Miyamoto  REFERRING PHYSICIAN:   Dr. 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 She used Radioplex gel during her course of treatment.                          PLAN: Routine followup in one month. Patient instructed to call if questions or worsening complaints in interim.

## 2013-05-30 NOTE — Progress Notes (Signed)
Weekly Management Note:  Site: Left breast Current Dose:  5000  cGy Projected Dose: 5000  cGy  Narrative: The patient is seen today for routine under treatment assessment. CBCT/MVCT images/port films were reviewed. The chart was reviewed.   She finishes her radiation therapy today. She does have mild left nipple discomfort. She uses Radioplex gel.  Physical Examination:  Filed Vitals:   05/30/13 1635  BP: 110/69  Pulse: 68  Temp: 98.2 F (36.8 C)  .  Weight: 155 lb 11.2 oz (70.625 kg). There is hyperpigmentation of the skin the left breast with patchy dry desquamation along the nipple, axilla and inframammary area regions.  Impression: Radiation therapy completed.  Plan: Followup visit in one month.

## 2013-05-31 ENCOUNTER — Ambulatory Visit: Payer: Medicaid Other

## 2013-06-01 ENCOUNTER — Ambulatory Visit: Payer: Medicaid Other

## 2013-06-01 ENCOUNTER — Ambulatory Visit: Payer: Medicaid Other | Admitting: Oncology

## 2013-06-01 ENCOUNTER — Telehealth: Payer: Self-pay | Admitting: Oncology

## 2013-06-01 NOTE — Telephone Encounter (Signed)
, °

## 2013-06-02 ENCOUNTER — Ambulatory Visit: Payer: Medicaid Other

## 2013-06-02 ENCOUNTER — Other Ambulatory Visit: Payer: Self-pay

## 2013-06-03 ENCOUNTER — Ambulatory Visit: Payer: Medicaid Other

## 2013-06-06 ENCOUNTER — Ambulatory Visit: Payer: Medicaid Other

## 2013-06-07 ENCOUNTER — Ambulatory Visit: Payer: Medicaid Other

## 2013-06-08 ENCOUNTER — Ambulatory Visit: Payer: Medicaid Other

## 2013-06-08 ENCOUNTER — Ambulatory Visit (HOSPITAL_BASED_OUTPATIENT_CLINIC_OR_DEPARTMENT_OTHER): Payer: Medicaid Other | Admitting: Adult Health

## 2013-06-08 ENCOUNTER — Telehealth: Payer: Self-pay | Admitting: Oncology

## 2013-06-08 ENCOUNTER — Encounter: Payer: Self-pay | Admitting: Adult Health

## 2013-06-08 VITALS — BP 106/68 | HR 77 | Temp 98.3°F | Resp 20 | Ht 68.0 in | Wt 154.6 lb

## 2013-06-08 DIAGNOSIS — C50312 Malignant neoplasm of lower-inner quadrant of left female breast: Secondary | ICD-10-CM

## 2013-06-08 DIAGNOSIS — C50319 Malignant neoplasm of lower-inner quadrant of unspecified female breast: Secondary | ICD-10-CM

## 2013-06-08 MED ORDER — TAMOXIFEN CITRATE 20 MG PO TABS: 20.0000 mg | ORAL_TABLET | Freq: Every day | ORAL | Status: DC

## 2013-06-08 NOTE — Progress Notes (Addendum)
OFFICE PROGRESS NOTE  CC**  Michelle A, MD [address] [place] [address] Dr. Abigail Miyamoto  Dr. 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 On 01/21/2013 she had additional views performed that did show subareolar left breast calcifications. Biopsy was diagnostic for invasive ductal carcinoma with ductal carcinoma in situ. Tumor was ER +100% PR +60% with Petty proliferation marker Ki-67 11%. On 01/31/2013 MRI of the breasts were performed which showed 2.5 x 2.8 x 2.2 cm hematoma along the lower inner periareolar region at the biopsy site. 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 But her lateral margin was negative but close at 0.1 cm. patient did undergo reexcision on 03/18/13 by Dr. Magnus Ivan.    #3 Patient underwent radiation therapy under the care of Dr. Dayton Scrape from 04/26/13 through 05/30/13.    #4 Patient to start Tamoxifen in 05/2013.  CURRENT THERAPY: Tamoxifen daily  INTERVAL HISTORY: Michelle Petty 47 y.o. female returns for evaluation prior to starting Tamoxifen therapy.  She is doing well today.  She has completed radiation therapy.  Her skin peeled the last week, but is healing at this point.  Otherwise, she is well and Petty 10 point ROS is neg. We reviewed her health maintenance below.   MEDICAL HISTORY: Past Medical History  Diagnosis Date  . Breast cancer 01/21/13    left    ALLERGIES:  has No Known Allergies.  MEDICATIONS:   Current Outpatient Prescriptions  Medication Sig Dispense Refill  . hyaluronate sodium (RADIAPLEXRX) GEL Apply 1 application topically 2 (two) times daily.      Marland Kitchen ibuprofen (ADVIL,MOTRIN) 600 MG tablet Take 600 mg by mouth every 6 (six) hours as needed for pain.      . non-metallic deodorant Thornton Papas) MISC Apply topically daily as needed.      . tamoxifen (NOLVADEX) 20 MG tablet Take 1 tablet (20 mg total) by mouth daily.  30 tablet  3   No current facility-administered medications for this visit.    SURGICAL HISTORY:  Past Surgical History  Procedure Laterality Date  . Achilles tendon repair Left 1985  . Breast biopsy Left 01/21/13    malignant   . Cesarean section  2001  . Wisdom tooth extraction    . Breast lumpectomy with needle localization and axillary sentinel lymph node bx Left 02/23/2013    Procedure: BREAST LUMPECTOMY WITH NEEDLE LOCALIZATION AND AXILLARY SENTINEL LYMPH NODE BX;  Surgeon: Shelly Rubenstein, MD;  Location: [facility];  Service: General;  Laterality: Left;  . Excision of breast biopsy Left 03/18/2013    Procedure: Re-Excision of Breast Cancer, Lateral Margin;  Surgeon: Shelly Rubenstein, MD;  Location: [facility];  Service: General;  Laterality: Left;    REVIEW OF SYSTEMS:  Petty 10 point review of systems was conducted and is otherwise negative except for what is noted above.    Health Maintenance Mammogram:  01/2013 Colonoscopy:  n/Petty Bone Density Scan:n/Petty Pap Smear: 12/2012  Eye Exam: 2 years ago Vitamin D Level: next draw Lipid Panel: unknown   PHYSICAL EXAMINATION: Blood pressure 106/68, pulse 77, temperature 98.3 F (36.8 C), temperature source Oral, resp. rate 20, height 5\' 8"  (1.727 m), weight 154 lb 9.6 oz (70.126 kg), last menstrual period 05/01/2013. Body mass index is 23.51 kg/(m^2). General: Patient is Petty well appearing female in no acute distress HEENT: PERRLA, sclerae anicteric no conjunctival pallor, MMM Neck:  supple, no palpable adenopathy Lungs: clear to auscultation bilaterally, no wheezes, rhonchi, or rales Cardiovascular: regular rate rhythm, S1, S2, no murmurs, rubs or gallops Abdomen: Soft, non-tender, non-distended, normoactive bowel sounds, no HSM Extremities: warm and well perfused, no clubbing, cyanosis, or edema Skin: No rashes or lesions Neuro: Non-focal Breasts: left breast peeling on upper and lower breast.  It is healing, radiation changes present.  Right breast no nodules or masses.  ECOG PERFORMANCE STATUS: 0 - Asymptomatic  LABORATORY DATA: Lab Results  Component Value Date   WBC 6.5 02/02/2013   HGB 11.7* 03/18/2013   HCT 35.7 02/02/2013   MCV 84.2 02/02/2013   PLT 243 02/02/2013      Chemistry      Component Value Date/Time   NA 139 02/02/2013 1209   NA 139 01/06/2013 1433   K 3.8 02/02/2013 1209   K 4.3 01/06/2013 1433   CL 104 01/06/2013 1433   CO2 27 02/02/2013 1209   CO2 27 01/06/2013 1433   BUN 7.4 02/02/2013 1209   BUN 7 01/06/2013 1433   CREATININE 0.8 02/02/2013 1209   CREATININE 0.86 01/06/2013 1433      Component Value Date/Time   CALCIUM 9.0 02/02/2013 1209   CALCIUM 9.1 01/06/2013 1433   ALKPHOS 53 02/02/2013 1209   ALKPHOS 53 01/06/2013 1433   AST 12 02/02/2013 1209   AST 16 01/06/2013 1433   ALT 9 02/02/2013 1209   ALT 10 01/06/2013 1433   BILITOT 0.37 02/02/2013 1209   BILITOT 0.4 01/06/2013 1433       RADIOGRAPHIC STUDIES:  Nm Sentinel Node Inj-no Rpt (breast)  02/23/2013   CLINICAL DATA: left breast cancer   Sulfur colloid was injected intradermally by the nuclear medicine  technologist for breast cancer sentinel node localization.    Mm Lt Plc Breast Loc Dev   1st Lesion  Inc Mammo Guide  02/23/2013   *RADIOLOGY REPORT*  Clinical Data: Known left breast invasive ductal and DCIS.  NEEDLE LOCALIZATION WITH MAMMOGRAPHIC GUIDANCE AND SPECIMEN RADIOGRAPH  Comparison:  Previous exam(s).  Patient presents for needle localization prior to surgery. I met with the patient and  we discussed the procedure of needle localization including benefits and alternatives. We discussed the high likelihood of Petty successful procedure. We discussed the risks of the procedure, including infection, bleeding, tissue injury, and further surgery. Informed, written consent was given. The usual time-out protocol was performed immediately prior to the procedure.  Using mammographic guidance, sterile technique, 2% lidocaine and Petty #5 modified Kopans needle the clip in the medial aspect of the left breast was localized using Petty medial approach.  The films are marked for Dr. Magnus Ivan. Specimen radiograph was performed at Day Surgery and confirms the clip is present in the tissue sample.  The specimen is marked for pathology.  IMPRESSION: Needle localization of the left breast.  No apparent complications.   Original Report Authenticated By: Baird Lyons, M.D.    ASSESSMENT: 47 year old female with  #1 IDC/DCIS status post left breast lumpectomy and sentinel lymph node biopsy. Postoperatively  she is doing well. She had negative margins but close lateral margin 0.1 cm. She underwent re-excision on 03/18/13.   #2  Patient underwent radiation therapy under the care of Dr. Dayton Scrape from 04/26/13 through 05/30/13.    #3 Adjuvant Tamoxifen therapy starting 06/08/13.  PLAN:   #1 We discussed starting Tamoxifen today and the risks involved.  I recommended she continue to follow with the gynecologist, have annual eye exams, cholesterol checks.  I gave her detailed information in her AVS.    #2 We discussed survivorship.    #3 She will return in 3 months for labs and evaluation of how she is tolerating the tamoxifen.   All questions were answered. The patient knows to call the clinic with any problems, questions or concerns. We can certainly see the patient much sooner if necessary.  I spent 25 minutes counseling the patient face to face. The total time spent in the appointment was 30 minutes.  Illa Level, NP Medical Oncology [facility] [phone]   ATTENDING'S ATTESTATION:  I personally reviewed patient's chart, examined patient myself, formulated the treatment plan as followed.    47 year old female with stage I invasive ductal carcinoma of the left breast originally diagnosed in June 2014. She's undergone Petty lumpectomy. Followed by radiation therapy. She is now ready to begin antiestrogen therapy with tamoxifen 20 mg daily. We discussed side effects risks benefits of treatment. HER-2 was given to her. She will continue this for Petty total of 5-10 years.  Drue Second, MD Medical/Oncology [facility] [phone] (beeper) [phone] (Office)  06/30/2013, 6:37 PM

## 2013-06-08 NOTE — Patient Instructions (Signed)

## 2013-06-08 NOTE — Telephone Encounter (Signed)
, °

## 2013-06-09 ENCOUNTER — Ambulatory Visit: Payer: Medicaid Other

## 2013-06-30 ENCOUNTER — Encounter: Payer: Self-pay | Admitting: *Deleted

## 2013-07-05 ENCOUNTER — Encounter: Payer: Self-pay | Admitting: Radiation Oncology

## 2013-07-05 ENCOUNTER — Ambulatory Visit
Admission: RE | Admit: 2013-07-05 | Discharge: 2013-07-05 | Disposition: A | Discharge status: Home / Self Care | Payer: Medicaid Other | Source: Ambulatory Visit | Attending: Radiation Oncology | Admitting: Radiation Oncology

## 2013-07-05 VITALS — BP 119/79 | HR 78 | Temp 98.1°F | Resp 20 | Wt 151.1 lb

## 2013-07-05 DIAGNOSIS — C50312 Malignant neoplasm of lower-inner quadrant of left female breast: Secondary | ICD-10-CM

## 2013-07-05 NOTE — Progress Notes (Signed)
Followup note:  The patient returns today approximately 5 weeks following completion of radiation therapy following conservative surgery in the management of her DCIS of the left breast. She is now on adjuvant tamoxifen through Dr. Welton Flakes. She sees Dr. Welton Flakes back for a followup visit in February. She will see Dr. Magnus Ivan for a followup visit in January. She is without complaints today.  Physical examination: Alert and oriented. Filed Vitals:   07/05/13 1549  BP: 119/79  Pulse: 78  Temp: 98.1 F (36.7 C)  Resp: 20   Head and neck examination: Grossly unremarkable. Nodes: Without palpable cervical, supraclavicular, or axillary lymphadenopathy. Chest: Lungs clear. Breasts: There is residual hyperpigmentation of the skin along the left breast with minimal thickening. No masses are appreciated. Right breast without masses or lesions. Abdomen without hepatomegaly. Extremities: Without edema.  Impression: Satisfactory progress. I think it would be reasonable for her to have a baseline left breast mammogram and a screening right breast mammogram later this summer. Her previous mammogram was at the [facility]. This can be scheduled through Dr. Welton Flakes with Dr. Magnus Ivan.  Plan: Followup through Dr. Welton Flakes and Dr. Magnus Ivan.

## 2013-07-05 NOTE — Progress Notes (Signed)
Pt denies pain, fatigue, loss of appetite. She is still applying Radiaplex to left breast, states skin almost completely healed. Pt taking Tamoxifen daily.

## 2013-08-30 ENCOUNTER — Telehealth: Payer: Self-pay | Admitting: Oncology

## 2013-08-30 NOTE — Telephone Encounter (Signed)
, °

## 2013-09-08 ENCOUNTER — Other Ambulatory Visit: Payer: Medicaid Other

## 2013-09-08 ENCOUNTER — Ambulatory Visit: Payer: Medicaid Other | Admitting: Adult Health

## 2013-09-13 ENCOUNTER — Telehealth: Payer: Self-pay | Admitting: Oncology

## 2013-09-13 ENCOUNTER — Other Ambulatory Visit: Payer: Self-pay | Admitting: *Deleted

## 2013-09-13 DIAGNOSIS — C50319 Malignant neoplasm of lower-inner quadrant of unspecified female breast: Secondary | ICD-10-CM

## 2013-09-13 NOTE — Telephone Encounter (Signed)
, °

## 2013-09-14 ENCOUNTER — Encounter: Payer: Self-pay | Admitting: Adult Health

## 2013-09-14 ENCOUNTER — Other Ambulatory Visit: Payer: Medicaid Other

## 2013-09-15 NOTE — Progress Notes (Signed)
This encounter was created in error - please disregard.

## 2013-09-21 ENCOUNTER — Ambulatory Visit (HOSPITAL_BASED_OUTPATIENT_CLINIC_OR_DEPARTMENT_OTHER): Payer: Self-pay | Admitting: Adult Health

## 2013-09-21 ENCOUNTER — Encounter: Payer: Self-pay | Admitting: Adult Health

## 2013-09-21 ENCOUNTER — Other Ambulatory Visit (HOSPITAL_BASED_OUTPATIENT_CLINIC_OR_DEPARTMENT_OTHER): Payer: Self-pay

## 2013-09-21 ENCOUNTER — Telehealth: Payer: Self-pay | Admitting: Oncology

## 2013-09-21 VITALS — BP 100/64 | HR 64 | Temp 98.6°F | Resp 18 | Ht 68.0 in | Wt 151.2 lb

## 2013-09-21 DIAGNOSIS — C50919 Malignant neoplasm of unspecified site of unspecified female breast: Secondary | ICD-10-CM

## 2013-09-21 DIAGNOSIS — C50319 Malignant neoplasm of lower-inner quadrant of unspecified female breast: Secondary | ICD-10-CM

## 2013-09-21 DIAGNOSIS — Z170 Estrogen receptor positive status [ER+]: Secondary | ICD-10-CM

## 2013-09-21 LAB — COMPREHENSIVE METABOLIC PANEL (CC13)
ALBUMIN: 3.4 g/dL — AB (ref 3.5–5.0)
ALK PHOS: 40 U/L (ref 40–150)
ALT: 7 U/L (ref 0–55)
AST: 14 U/L (ref 5–34)
Anion Gap: 7 mEq/L (ref 3–11)
BUN: 8.6 mg/dL (ref 7.0–26.0)
CO2: 25 mEq/L (ref 22–29)
Calcium: 8.6 mg/dL (ref 8.4–10.4)
Chloride: 109 mEq/L (ref 98–109)
Creatinine: 0.8 mg/dL (ref 0.6–1.1)
Glucose: 92 mg/dl (ref 70–140)
POTASSIUM: 3.7 meq/L (ref 3.5–5.1)
SODIUM: 141 meq/L (ref 136–145)
TOTAL PROTEIN: 6.7 g/dL (ref 6.4–8.3)
Total Bilirubin: 0.38 mg/dL (ref 0.20–1.20)

## 2013-09-21 LAB — CBC WITH DIFFERENTIAL/PLATELET
BASO%: 0.5 % (ref 0.0–2.0)
Basophils Absolute: 0 10*3/uL (ref 0.0–0.1)
EOS%: 0.8 % (ref 0.0–7.0)
Eosinophils Absolute: 0.1 10*3/uL (ref 0.0–0.5)
HCT: 34.7 % — ABNORMAL LOW (ref 34.8–46.6)
HGB: 11.5 g/dL — ABNORMAL LOW (ref 11.6–15.9)
LYMPH%: 19.1 % (ref 14.0–49.7)
MCH: 28.9 pg (ref 25.1–34.0)
MCHC: 33.1 g/dL (ref 31.5–36.0)
MCV: 87.3 fL (ref 79.5–101.0)
MONO#: 0.6 10*3/uL (ref 0.1–0.9)
MONO%: 8.7 % (ref 0.0–14.0)
NEUT#: 4.9 10*3/uL (ref 1.5–6.5)
NEUT%: 70.9 % (ref 38.4–76.8)
Platelets: 207 10*3/uL (ref 145–400)
RBC: 3.98 10*6/uL (ref 3.70–5.45)
RDW: 14.2 % (ref 11.2–14.5)
WBC: 7 10*3/uL (ref 3.9–10.3)
lymph#: 1.3 10*3/uL (ref 0.9–3.3)

## 2013-09-21 NOTE — Progress Notes (Signed)
OFFICE PROGRESS NOTE  CC**  [name],[name], MD [address] [place] [address] Dr. Coralie Keens  Dr. 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 On 01/21/2013 she had additional views performed that did show subareolar left breast calcifications. Biopsy was diagnostic for invasive ductal carcinoma with ductal carcinoma in situ. Tumor was ER +100% PR +60% with a proliferation marker Ki-67 11%. On 01/31/2013 MRI of the breasts were performed which showed 2.5 x 2.8 x 2.2 cm hematoma along the lower inner periareolar region at the biopsy site. 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 But her lateral margin was negative but close at 0.1 cm. patient did undergo reexcision on 03/18/13 by Dr. Ninfa Linden.    #3 Patient underwent radiation therapy under the care of Dr. Valere Dross from 04/26/13 through 05/30/13.    #4 Patient to start Tamoxifen in 05/2013.  CURRENT THERAPY: Tamoxifen daily  INTERVAL HISTORY: Michelle Petty 48 y.o. female returns for evaluation and follow up of her left breast invasive ductal carcinoma.  She is doing well today.  She is taking Tamoxifen daily and tolerating it well.  She has occasional hot flashes, but otherwise denies fevers, chills, night sweats, unintentional weight loss, nausea, vomiting, constipation, diarrhea, pain, or any other concerns.  A 10 point ROS is negative.   MEDICAL HISTORY: Past Medical History  Diagnosis Date   . Breast cancer 01/21/13    left  . Hx of radiation therapy 04/26/13- 05/30/13    left breast 5000 cGy 25 sessions    ALLERGIES:  has No Known Allergies.  MEDICATIONS:  Current Outpatient Prescriptions  Medication Sig Dispense Refill  . tamoxifen (NOLVADEX) 20 MG tablet Take 1 tablet (20 mg total) by mouth daily.  30 tablet  3  . ibuprofen (ADVIL,MOTRIN) 600 MG tablet Take 600 mg by mouth every 6 (six) hours as needed for pain.       No current facility-administered medications for this visit.    SURGICAL HISTORY:  Past Surgical History  Procedure Laterality Date  . Achilles tendon repair Left 1985  . Breast biopsy Left 01/21/13    malignant   . Cesarean section  2001  . Wisdom tooth extraction    . Breast lumpectomy with needle localization and axillary sentinel lymph node bx Left 02/23/2013    Procedure: BREAST LUMPECTOMY WITH NEEDLE LOCALIZATION AND AXILLARY SENTINEL LYMPH NODE BX;  Surgeon: Harl Bowie, MD;  Location: [facility];  Service: General;  Laterality: Left;  . Excision of breast biopsy Left 03/18/2013    Procedure: Re-Excision of Breast Cancer, Lateral Margin;  Surgeon: Harl Bowie, MD;  Location: [place];  Service: General;  Laterality: Left;    REVIEW OF SYSTEMS:  A 10 point review of systems was conducted and is otherwise negative except for what is noted above.    Health Maintenance Mammogram:  01/2013 Colonoscopy:  n/a Bone Density Scan:n/a Pap Smear: 12/2012 Eye Exam: Scheduled 10/05/13 Vitamin D  Level: next draw Lipid Panel: unknown   PHYSICAL EXAMINATION: Blood pressure 100/64, pulse 64, temperature 98.6 F (37 C), temperature source Oral, resp. rate 18, height _0  (1.727 m), weight 151 lb 3.2 oz (68.584 kg). Body mass index is 23 kg/(m^2). GENERAL: Patient is a well appearing female in no acute distress HEENT:  Sclerae anicteric.  Oropharynx clear and moist. No ulcerations or evidence of oropharyngeal  candidiasis. Neck is supple.  NODES:  No cervical, supraclavicular, or axillary lymphadenopathy palpated.  BREAST EXAM:  Left breast with hyperpigmentation and skin thickness due to radiation, no masses nodules lesions skin changes or nipple discharge in either breast.  Benign breast exam.  LUNGS:  Clear to auscultation bilaterally.  No wheezes or rhonchi. HEART:  Regular rate and rhythm. No murmur appreciated. ABDOMEN:  Soft, nontender.  Positive, normoactive bowel sounds. No organomegaly palpated. MSK:  No focal spinal tenderness to palpation. Full range of motion bilaterally in the upper extremities. EXTREMITIES:  No peripheral edema.   SKIN:  Clear with no obvious rashes or skin changes. No nail dyscrasia. NEURO:  Nonfocal. Well oriented.  Appropriate affect. ECOG PERFORMANCE STATUS: 0 - Asymptomatic  LABORATORY DATA: Lab Results  Component Value Date   WBC 7.0 09/21/2013   HGB 11.5* 09/21/2013   HCT 34.7* 09/21/2013   MCV 87.3 09/21/2013   PLT 207 09/21/2013      Chemistry      Component Value Date/Time   NA 141 09/21/2013 1448   NA 139 01/06/2013 1433   K 3.7 09/21/2013 1448   K 4.3 01/06/2013 1433   CL 104 01/06/2013 1433   CO2 25 09/21/2013 1448   CO2 27 01/06/2013 1433   BUN 8.6 09/21/2013 1448   BUN 7 01/06/2013 1433   CREATININE 0.8 09/21/2013 1448   CREATININE 0.86 01/06/2013 1433      Component Value Date/Time   CALCIUM 8.6 09/21/2013 1448   CALCIUM 9.1 01/06/2013 1433   ALKPHOS 40 09/21/2013 1448   ALKPHOS 53 01/06/2013 1433   AST 14 09/21/2013 1448   AST 16 01/06/2013 1433   ALT 7 09/21/2013 1448   ALT 10 01/06/2013 1433   BILITOT 0.38 09/21/2013 1448   BILITOT 0.4 01/06/2013 1433       RADIOGRAPHIC STUDIES:  Nm Sentinel Node Inj-no Rpt (breast)  02/23/2013   CLINICAL DATA: left breast cancer   Sulfur colloid was injected intradermally by the nuclear medicine  technologist for breast cancer sentinel node localization.    Mm Lt Plc Breast Loc Dev   1st Lesion  Inc Mammo  Guide  02/23/2013   *RADIOLOGY REPORT*  Clinical Data: Known left breast invasive ductal and DCIS.  NEEDLE LOCALIZATION WITH MAMMOGRAPHIC GUIDANCE AND SPECIMEN RADIOGRAPH  Comparison:  Previous exam(s).  Patient presents for needle localization prior to surgery. I met with the patient and we discussed the procedure of needle localization including benefits and alternatives. We discussed the high likelihood of a successful procedure. We discussed the risks of the procedure, including infection, bleeding, tissue injury, and further surgery. Informed, written consent was given. The usual time-out protocol was performed immediately prior to the procedure.  Using mammographic guidance, sterile technique, 2% lidocaine and a #5 modified Kopans needle the clip in the medial aspect of the left breast was localized using a medial approach.  The films are marked for Dr. Ninfa Linden. Specimen radiograph was performed at Day Surgery and confirms the clip is present in the tissue sample.  The specimen is marked for pathology.  IMPRESSION: Needle localization of the left breast.  No apparent complications.   Original Report Authenticated By: Lillia Mountain, M.D.    ASSESSMENT: 48 year old female with  #1 IDC/DCIS status post left breast lumpectomy and sentinel lymph node biopsy. Postoperatively she is doing well. She had negative margins but close lateral margin 0.1 cm. She underwent re-excision on 03/18/13.   #2  Patient underwent radiation therapy under the care of Dr. Valere Dross from 04/26/13 through 05/30/13.    #3 Adjuvant Tamoxifen therapy starting 06/08/13.  PLAN:   #1 Patient is doing well.  She has no sign of recurrence.  She is tolerating the Tamoxifen well.  She will continue this daily.  Her CBC is normal, I reviewed this with her in detail.  Her CMP is pending.    #2 We discussed survivorship in detail.    #3 She will return in 6 months for labs and evaluation of how she is tolerating the tamoxifen.   All  questions were answered. The patient knows to call the clinic with any problems, questions or concerns. We can certainly see the patient much sooner if necessary.  I spent 25 minutes counseling the patient face to face. The total time spent in the appointment was 30 minutes.  Minette Headland, [place] [phone] 09/22/2013, 9:10 PM

## 2013-10-21 ENCOUNTER — Encounter (INDEPENDENT_AMBULATORY_CARE_PROVIDER_SITE_OTHER): Payer: Self-pay | Admitting: Surgery

## 2013-10-21 ENCOUNTER — Ambulatory Visit (INDEPENDENT_AMBULATORY_CARE_PROVIDER_SITE_OTHER): Payer: Medicaid Other | Admitting: Surgery

## 2013-10-21 VITALS — BP 108/74 | HR 71 | Temp 97.9°F | Resp 16 | Ht 68.0 in | Wt 151.6 lb

## 2013-10-21 DIAGNOSIS — Z853 Personal history of malignant neoplasm of breast: Secondary | ICD-10-CM

## 2013-10-21 NOTE — Progress Notes (Signed)
Subjective:     Patient ID: Michelle Petty, female   DOB: [birth year], 48 y.o.   MRN: [id number]  HPI She is here for a 3 month followup of her left breast cancer. She finished her radiation therapy in November and is on tamoxifen. She has no complaints today  Review of Systems     Objective:   Physical Exam On exam, there is no axillary adenopathy. The left breast looks normal. There are no skin changes and the incision is healing well. There are no palpable breast masses    Assessment:     Patient stable with a history of left breast cancer     Plan:     She will continue her current care at the cancer center. Her mammograms will be in June of this year. I will see her back in 6 months and lost her problem the mammograms

## 2013-10-26 ENCOUNTER — Other Ambulatory Visit: Payer: Self-pay | Admitting: Obstetrics & Gynecology

## 2013-10-26 ENCOUNTER — Encounter: Payer: Self-pay | Admitting: Obstetrics

## 2013-10-26 ENCOUNTER — Ambulatory Visit (INDEPENDENT_AMBULATORY_CARE_PROVIDER_SITE_OTHER): Payer: Self-pay | Admitting: Obstetrics & Gynecology

## 2013-10-26 ENCOUNTER — Ambulatory Visit (INDEPENDENT_AMBULATORY_CARE_PROVIDER_SITE_OTHER): Payer: Medicaid Other

## 2013-10-26 VITALS — BP 124/77 | HR 81 | Temp 98.5°F | Ht 68.0 in | Wt 155.0 lb

## 2013-10-26 DIAGNOSIS — Z853 Personal history of malignant neoplasm of breast: Secondary | ICD-10-CM

## 2013-10-26 DIAGNOSIS — N926 Irregular menstruation, unspecified: Secondary | ICD-10-CM

## 2013-10-26 DIAGNOSIS — N939 Abnormal uterine and vaginal bleeding, unspecified: Secondary | ICD-10-CM

## 2013-10-26 LAB — POCT URINE PREGNANCY: Preg Test, Ur: NEGATIVE

## 2013-10-26 NOTE — Progress Notes (Unsigned)
Subjective:     Michelle Petty is a 48 y.o. female here for a routine exam.  Current complaints: Patient is in the office today for a problem visit. Patient states she started the tamoxifen in November, she had a normal cycle in December. Patient states in January it came on for 2 days went off for a week and came back on for a couple more days. Patient states she did not have a cycle in February and that in March her cycle was very heavy but that it lasted normal amount of time.  The HPI was reviewed and explored in further detail by the provider. Gynecologic History Patient's last menstrual period was 10/10/2013. Contraception: condoms Last Pap: 01/06/2013. Results were: normal Last mammogram: 01/2013. Results were: abnormal  Obstetric History OB History  Gravida Para Term Preterm AB SAB TAB Ectopic Multiple Living  2 2 2  0 0 0 0 0 0 2    # Outcome Date GA Lbr Len/2nd Weight Sex Delivery Anes PTL Lv  2 TRM           1 TRM                The following portions of the patient's history were reviewed and updated as appropriate: allergies, current medications, past family history, past medical history, past social history, past surgical history and problem list.  Review of Systems Pertinent items are noted in HPI.    Objective:      General:  alert     Abdomen: soft, non-tender; bowel sounds normal; no masses,  no organomegaly   Vulva:  normal  Vagina: normal vagina  Cervix:  no lesions  Corpus: normal size, contour, position, consistency, mobility, non-tender  Adnexa:  normal adnexa      U/S today normal endometrial stripe 50% of 15 min visit spent on counseling and coordination of care.   Assessment:   AUB on Tamoxifen Requests contraception  Plan:   Reviewed non hormonal methods ?candidate for Mirena IUD for endometrial protection--will d/w med onc Return in a few months

## 2013-10-26 NOTE — Progress Notes (Unsigned)
Subjective:     Michelle Petty is a 48 y.o. female here for a routine exam.  Current complaints: ***.    Personal health questionnaire:  Is patient Ashkenazi Jewish, have a family history of breast and/or ovarian cancer: {YES NO:22349} Is there a family history of uterine cancer diagnosed at age < 73, gastrointestinal cancer, urinary tract cancer, family member who is a Field seismologist syndrome-associated carrier: {YES NO:22349} Is the patient overweight and hypertensive, family history of diabetes, personal history of gestational diabetes or PCOS: {YES NO:22349} Is patient over 37, have PCOS,  family history of premature CHD under age 97, diabetes, smoke, have hypertension or peripheral artery disease:  {YES NO:22349}  The HPI was reviewed and explored in further detail by the provider. Gynecologic History No LMP recorded. Contraception: {method:5051} Last Pap: ***. Results were: {norm/abn:16337} Last mammogram: ***. Results were: {norm/abn:16337}  Obstetric History OB History  Gravida Para Term Preterm AB SAB TAB Ectopic Multiple Living  2 2 2  0 0 0 0 0 0 2    # Outcome Date GA Lbr Len/2nd Weight Sex Delivery Anes PTL Lv  2 TRM           1 TRM                {Common ambulatory SmartLinks:19316}  Review of Systems {ros; complete:30496}    Objective:    {exam; complete:18323}    ***% of *** min visit spent on counseling and coordination of care.   Assessment:    Healthy female exam.    Plan:    {plan:19193}

## 2013-10-27 ENCOUNTER — Telehealth: Payer: Self-pay

## 2013-10-27 NOTE — Telephone Encounter (Signed)
Rcvd return call from Dr. Lahoma Crocker re: pt.  She can be reached on her cell [phone].  Routed to [facility]

## 2013-11-07 DIAGNOSIS — M25569 Pain in unspecified knee: Secondary | ICD-10-CM | POA: Insufficient documentation

## 2013-11-07 DIAGNOSIS — N920 Excessive and frequent menstruation with regular cycle: Secondary | ICD-10-CM

## 2013-11-07 DIAGNOSIS — N926 Irregular menstruation, unspecified: Secondary | ICD-10-CM | POA: Insufficient documentation

## 2013-11-07 HISTORY — DX: Excessive and frequent menstruation with regular cycle: N92.0

## 2013-11-07 HISTORY — DX: Irregular menstruation, unspecified: N92.6

## 2013-12-16 ENCOUNTER — Other Ambulatory Visit: Payer: Self-pay | Admitting: Adult Health

## 2013-12-16 DIAGNOSIS — Z853 Personal history of malignant neoplasm of breast: Secondary | ICD-10-CM

## 2013-12-20 ENCOUNTER — Telehealth: Payer: Self-pay | Admitting: *Deleted

## 2013-12-20 NOTE — Telephone Encounter (Signed)
Called patient - informed of Dr Glennon Mac Moore's recommendation. Patient will consider her option and let us know.

## 2013-12-20 NOTE — Telephone Encounter (Signed)
Message copied by Romana Juniper on Tue Dec 20, 2013  5:18 PM ------      Message from: Lahoma Crocker      Created: Sun Nov 20, 2013  3:31 PM       Pt cannot have mirena IUD; can have copper T IUD ------

## 2014-01-23 ENCOUNTER — Ambulatory Visit
Admission: RE | Admit: 2014-01-23 | Discharge: 2014-01-23 | Disposition: A | Discharge status: Home / Self Care | Payer: Medicaid Other | Source: Ambulatory Visit | Attending: Adult Health | Admitting: Adult Health

## 2014-01-23 DIAGNOSIS — Z853 Personal history of malignant neoplasm of breast: Secondary | ICD-10-CM

## 2014-03-15 ENCOUNTER — Other Ambulatory Visit: Payer: Self-pay | Admitting: *Deleted

## 2014-03-15 DIAGNOSIS — C50319 Malignant neoplasm of lower-inner quadrant of unspecified female breast: Secondary | ICD-10-CM

## 2014-03-16 ENCOUNTER — Other Ambulatory Visit (HOSPITAL_BASED_OUTPATIENT_CLINIC_OR_DEPARTMENT_OTHER): Payer: Medicaid Other

## 2014-03-16 DIAGNOSIS — C50319 Malignant neoplasm of lower-inner quadrant of unspecified female breast: Secondary | ICD-10-CM

## 2014-03-16 LAB — CBC WITH DIFFERENTIAL/PLATELET
BASO%: 0.4 % (ref 0.0–2.0)
BASOS ABS: 0 10*3/uL (ref 0.0–0.1)
EOS%: 1.5 % (ref 0.0–7.0)
Eosinophils Absolute: 0.1 10*3/uL (ref 0.0–0.5)
HEMATOCRIT: 35.1 % (ref 34.8–46.6)
HGB: 12.2 g/dL (ref 11.6–15.9)
LYMPH%: 22 % (ref 14.0–49.7)
MCH: 29.1 pg (ref 25.1–34.0)
MCHC: 34.8 g/dL (ref 31.5–36.0)
MCV: 83.8 fL (ref 79.5–101.0)
MONO#: 0.6 10*3/uL (ref 0.1–0.9)
MONO%: 8.8 % (ref 0.0–14.0)
NEUT#: 4.8 10*3/uL (ref 1.5–6.5)
NEUT%: 67.3 % (ref 38.4–76.8)
PLATELETS: 209 10*3/uL (ref 145–400)
RBC: 4.19 10*6/uL (ref 3.70–5.45)
RDW: 13.9 % (ref 11.2–14.5)
WBC: 7.2 10*3/uL (ref 3.9–10.3)
lymph#: 1.6 10*3/uL (ref 0.9–3.3)

## 2014-03-16 LAB — COMPREHENSIVE METABOLIC PANEL (CC13)
ALT: 12 U/L (ref 0–55)
ANION GAP: 7 meq/L (ref 3–11)
AST: 15 U/L (ref 5–34)
Albumin: 3.2 g/dL — ABNORMAL LOW (ref 3.5–5.0)
Alkaline Phosphatase: 36 U/L — ABNORMAL LOW (ref 40–150)
BILIRUBIN TOTAL: 0.34 mg/dL (ref 0.20–1.20)
BUN: 8.4 mg/dL (ref 7.0–26.0)
CO2: 22 meq/L (ref 22–29)
CREATININE: 0.9 mg/dL (ref 0.6–1.1)
Calcium: 8.8 mg/dL (ref 8.4–10.4)
Chloride: 110 mEq/L — ABNORMAL HIGH (ref 98–109)
GLUCOSE: 94 mg/dL (ref 70–140)
Potassium: 3.6 mEq/L (ref 3.5–5.1)
Sodium: 139 mEq/L (ref 136–145)
Total Protein: 6.7 g/dL (ref 6.4–8.3)

## 2014-03-21 ENCOUNTER — Telehealth: Payer: Self-pay | Admitting: Adult Health

## 2014-03-21 NOTE — Telephone Encounter (Signed)
per pt to r/s appt-r/s & gave pt appt time & date

## 2014-03-23 ENCOUNTER — Ambulatory Visit: Payer: Self-pay | Admitting: Adult Health

## 2014-03-28 ENCOUNTER — Telehealth: Payer: Self-pay | Admitting: Hematology and Oncology

## 2014-03-28 ENCOUNTER — Ambulatory Visit (HOSPITAL_BASED_OUTPATIENT_CLINIC_OR_DEPARTMENT_OTHER): Payer: Medicaid Other | Admitting: Adult Health

## 2014-03-28 ENCOUNTER — Encounter: Payer: Self-pay | Admitting: Adult Health

## 2014-03-28 VITALS — BP 118/71 | HR 72 | Temp 99.2°F | Resp 18 | Ht 68.0 in | Wt 157.3 lb

## 2014-03-28 DIAGNOSIS — Z170 Estrogen receptor positive status [ER+]: Secondary | ICD-10-CM

## 2014-03-28 DIAGNOSIS — C50319 Malignant neoplasm of lower-inner quadrant of unspecified female breast: Secondary | ICD-10-CM

## 2014-03-28 NOTE — Patient Instructions (Signed)
You are doing well.  You have no sign of recurrence.  Continue taking Tamoxifen daily.  I recommend healthy diet, exercise and monthly breast exams.    Breast Self-Awareness Practicing breast self-awareness may pick up problems early, prevent significant medical complications, and possibly save your life. By practicing breast self-awareness, you can become familiar with how your breasts look and feel and if your breasts are changing. This allows you to notice changes early. It can also offer you some reassurance that your breast health is good. One way to learn what is normal for your breasts and whether your breasts are changing is to do a breast self-exam. If you find a lump or something that was not present in the past, it is best to contact your caregiver right away. Other findings that should be evaluated by your caregiver include nipple discharge, especially if it is bloody; skin changes or reddening; areas where the skin seems to be pulled in (retracted); or new lumps and bumps. Breast pain is seldom associated with cancer (malignancy), but should also be evaluated by a caregiver. HOW TO PERFORM A BREAST SELF-EXAM The best time to examine your breasts is 5-7 days after your menstrual period is over. During menstruation, the breasts are lumpier, and it may be more difficult to pick up changes. If you do not menstruate, have reached menopause, or had your uterus removed (hysterectomy), you should examine your breasts at regular intervals, such as monthly. If you are breastfeeding, examine your breasts after a feeding or after using a breast pump. Breast implants do not decrease the risk for lumps or tumors, so continue to perform breast self-exams as recommended. Talk to your caregiver about how to determine the difference between the implant and breast tissue. Also, talk about the amount of pressure you should use during the exam. Over time, you will become more familiar with the variations of your  breasts and more comfortable with the exam. A breast self-exam requires you to remove all your clothes above the waist. 1. Look at your breasts and nipples. Stand in front of a mirror in a room with good lighting. With your hands on your hips, push your hands firmly downward. Look for a difference in shape, contour, and size from one breast to the other (asymmetry). Asymmetry includes puckers, dips, or bumps. Also, look for skin changes, such as reddened or scaly areas on the breasts. Look for nipple changes, such as discharge, dimpling, repositioning, or redness. 2. Carefully feel your breasts. This is best done either in the shower or tub while using soapy water or when flat on your back. Place the arm (on the side of the breast you are examining) above your head. Use the pads (not the fingertips) of your three middle fingers on your opposite hand to feel your breasts. Start in the underarm area and use  inch (2 cm) overlapping circles to feel your breast. Use 3 different levels of pressure (light, medium, and firm pressure) at each circle before moving to the next circle. The light pressure is needed to feel the tissue closest to the skin. The medium pressure will help to feel breast tissue a little deeper, while the firm pressure is needed to feel the tissue close to the ribs. Continue the overlapping circles, moving downward over the breast until you feel your ribs below your breast. Then, move one finger-width towards the center of the body. Continue to use the  inch (2 cm) overlapping circles to feel your breast as  you move slowly up toward the collar bone (clavicle) near the base of the neck. Continue the up and down exam using all 3 pressures until you reach the middle of the chest. Do this with each breast, carefully feeling for lumps or changes. 3.  Keep a written record with breast changes or normal findings for each breast. By writing this information down, you do not need to depend only on memory  for size, tenderness, or location. Write down where you are in your menstrual cycle, if you are still menstruating. Breast tissue can have some lumps or thick tissue. However, see your caregiver if you find anything that concerns you.  SEEK MEDICAL CARE IF:  You see a change in shape, contour, or size of your breasts or nipples.   You see skin changes, such as reddened or scaly areas on the breasts or nipples.   You have an unusual discharge from your nipples.   You feel a new lump or unusually thick areas.  Document Released: 07/14/2005 Document Revised: 06/30/2012 Document Reviewed: 10/29/2011 [facility] Patient Information 2015 [place], [place]. This information is not intended to replace advice given to you by your health care provider. Make sure you discuss any questions you have with your health care provider.

## 2014-03-28 NOTE — Progress Notes (Signed)
OFFICE PROGRESS NOTE  CC**  [name],[name], MD [address] [place] [address] Dr. Coralie Keens  Dr. 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 On 01/21/2013 she had additional views performed that did show subareolar left breast calcifications. Biopsy was diagnostic for invasive ductal carcinoma with ductal carcinoma in situ. Tumor was ER +100% PR +60% with a proliferation marker Ki-67 11%. On 01/31/2013 MRI of the breasts were performed which showed 2.5 x 2.8 x 2.2 cm hematoma along the lower inner periareolar region at the biopsy site. 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 But her lateral margin was negative but close at 0.1 cm. patient did undergo reexcision on 03/18/13 by Dr. Ninfa Linden.    #3 Patient underwent radiation therapy under the care of Dr. Valere Dross from 04/26/13 through 05/30/13.    #4 Patient to start Tamoxifen in 05/2013.  CURRENT THERAPY: Tamoxifen daily  INTERVAL HISTORY: DEMETRIS MEINHARDT 48 y.o. female returns for evaluation and follow up of her left breast invasive ductal carcinoma.  She is taking Tamoxifen daily.  She is tolerating it well.  She has gained 8 pounds.  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  She is eating more snacks lately, such as cheese, crackers, vegetables, and does feel like she is eating more.  She otherwise is doing well and a 10  point ROS is neg.  We reviewed her health maintenance below.    MEDICAL HISTORY: Past Medical History  Diagnosis Date  . Breast cancer 01/21/13    left  . Hx of radiation therapy 04/26/13- 05/30/13    left breast 5000 cGy 25 sessions    ALLERGIES:  has No Known Allergies.  MEDICATIONS:  Current Outpatient Prescriptions  Medication Sig Dispense Refill  . calcium-vitamin D (OSCAL WITH D) 500-200 MG-UNIT per tablet Take 1 tablet by mouth.      Marland Kitchen ibuprofen (ADVIL,MOTRIN) 600 MG tablet Take 600 mg by mouth every 6 (six) hours as needed for pain.      . tamoxifen (NOLVADEX) 20 MG tablet Take 1 tablet (20 mg total) by mouth daily.  30 tablet  3   No current facility-administered medications for this visit.    SURGICAL HISTORY:  Past Surgical History  Procedure Laterality Date  . Achilles tendon repair Left 1985  . Breast biopsy Left 01/21/13    malignant   . Cesarean section  2001  . Wisdom tooth extraction    . Breast lumpectomy with needle localization and axillary sentinel lymph node bx Left 02/23/2013    Procedure: BREAST LUMPECTOMY WITH NEEDLE LOCALIZATION AND AXILLARY SENTINEL LYMPH NODE BX;  Surgeon: Harl Bowie, MD;  Location: [place];  Service: General;  Laterality: Left;  . Excision of breast biopsy Left 03/18/2013    Procedure: Re-Excision of Breast Cancer, Lateral Margin;  Surgeon: Harl Bowie, MD;  Location: [place];  Service: General;  Laterality: Left;    REVIEW OF SYSTEMS:  A 10 point review of systems was conducted and is otherwise negative except for what is noted above.    Health Maintenance Mammogram:  01/25/2014 Colonoscopy:  n/a Bone Density Scan:n/a Pap Smear: 10/2013 Eye Exam: 7/15 Lipid Panel: 11/2013   PHYSICAL EXAMINATION: Blood pressure 118/71, pulse 72, temperature 99.2 F (37.3 C), temperature source Oral, resp. rate 18, height 5' 8"  (1.727 m), weight 157 lb 4.8 oz (71.351 kg). Body mass index is 23.92  kg/(m^2). GENERAL: Patient is a well appearing female in no acute distress HEENT:  Sclerae anicteric.  Oropharynx clear and moist. No ulcerations or evidence of oropharyngeal candidiasis. Neck is supple.  NODES:  No cervical, supraclavicular, or axillary lymphadenopathy palpated.  BREAST EXAM:  S/p left lumpectomy, no nodularity, masses, right breast no nodules or masses, benign bilateral breast exam LUNGS:  Clear to auscultation bilaterally.  No wheezes or rhonchi. HEART:  Regular rate and rhythm. No murmur appreciated. ABDOMEN:  Soft, nontender.  Positive, normoactive bowel sounds. No organomegaly palpated. MSK:  No focal spinal tenderness to palpation. Full range of motion bilaterally in the upper extremities. EXTREMITIES:  No peripheral edema.   SKIN:  Clear with no obvious rashes or skin changes. No nail dyscrasia. NEURO:  Nonfocal. Well oriented.  Appropriate affect. ECOG PERFORMANCE STATUS: 0 - Asymptomatic  LABORATORY DATA: Lab Results  Component Value Date   WBC 7.2 03/16/2014   HGB 12.2 03/16/2014   HCT 35.1 03/16/2014   MCV 83.8 03/16/2014   PLT 209 03/16/2014      Chemistry      Component Value Date/Time   NA 139 03/16/2014 1357   NA 139 01/06/2013 1433   K 3.6 03/16/2014 1357   K 4.3 01/06/2013 1433   CL 104 01/06/2013 1433   CO2 22 03/16/2014 1357   CO2 27 01/06/2013 1433   BUN 8.4 03/16/2014 1357   BUN 7 01/06/2013 1433   CREATININE 0.9 03/16/2014 1357   CREATININE 0.86 01/06/2013 1433      Component Value Date/Time   CALCIUM 8.8 03/16/2014 1357   CALCIUM 9.1 01/06/2013 1433   ALKPHOS 36* 03/16/2014 1357   ALKPHOS 53 01/06/2013 1433   AST 15 03/16/2014 1357   AST 16 01/06/2013 1433   ALT 12 03/16/2014 1357   ALT 10 01/06/2013 1433   BILITOT 0.34 03/16/2014 1357   BILITOT 0.4 01/06/2013 1433       RADIOGRAPHIC STUDIES:  Nm Sentinel Node Inj-no Rpt (breast)  02/23/2013   CLINICAL DATA: left breast cancer   Sulfur colloid was injected intradermally by the nuclear medicine   technologist for breast cancer sentinel node localization.    Mm Lt Plc Breast Loc Dev   1st Lesion  Inc Mammo Guide  02/23/2013   *RADIOLOGY REPORT*  Clinical Data: Known left breast invasive ductal and DCIS.  NEEDLE LOCALIZATION WITH MAMMOGRAPHIC GUIDANCE AND SPECIMEN RADIOGRAPH  Comparison:  Previous exam(s).  Patient presents for needle localization prior to surgery. I met with the patient and we discussed the procedure of needle localization including benefits and alternatives. We discussed the high likelihood of a successful procedure. We discussed the risks of the procedure, including infection, bleeding, tissue injury, and further surgery. Informed, written consent was given. The usual time-out protocol was performed immediately prior to the procedure.  Using mammographic guidance, sterile technique, 2% lidocaine and a #5 modified Kopans needle the clip in the medial aspect of the left breast was localized  using a medial approach.  The films are marked for Dr. Ninfa Linden. Specimen radiograph was performed at [facility] and confirms the clip is present in the tissue sample.  The specimen is marked for pathology.  IMPRESSION: Needle localization of the left breast.  No apparent complications.   Original Report Authenticated By: Lillia Mountain, M.D.    ASSESSMENT: 48 year old female with  #1 IDC/DCIS status post left breast lumpectomy and sentinel lymph node biopsy. Postoperatively she is doing well. She had negative margins but close lateral margin 0.1 cm. She underwent re-excision on 03/18/13.   #2  Patient underwent radiation therapy under the care of Dr. Valere Dross from 04/26/13 through 05/30/13.    #3 Adjuvant Tamoxifen therapy starting 06/08/13.  PLAN:   Margery is doing well today.  She has no sign of recurrence.  I reviewed her normal lab work with her.  She will continue taking Tamoxifen.  We discussed exercise and I recommended she increase the intensity slightly and exercise most days of the week.   I counseled her on a healthy diet and monthly breast exams.  She will return in 6 months for follow up and to establish care with Dr. Lindi Adie.   All questions were answered. The patient knows to call the clinic with any problems, questions or concerns. We can certainly see the patient much sooner if necessary.  I spent 25 minutes counseling the patient face to face. The total time spent in the appointment was 30 minutes.  Minette Headland, [place] [phone] 03/30/2014, 10:15 AM

## 2014-03-28 NOTE — Telephone Encounter (Signed)
per pof to sch appt-gave pt copy of sch °

## 2014-04-02 ENCOUNTER — Other Ambulatory Visit: Payer: Self-pay | Admitting: Adult Health

## 2014-04-24 ENCOUNTER — Ambulatory Visit (INDEPENDENT_AMBULATORY_CARE_PROVIDER_SITE_OTHER): Payer: Medicaid Other | Admitting: Surgery

## 2014-04-27 ENCOUNTER — Ambulatory Visit: Payer: Medicaid Other | Admitting: Obstetrics & Gynecology

## 2014-05-04 ENCOUNTER — Ambulatory Visit (INDEPENDENT_AMBULATORY_CARE_PROVIDER_SITE_OTHER): Payer: Medicaid Other | Admitting: Obstetrics & Gynecology

## 2014-05-04 ENCOUNTER — Encounter: Payer: Self-pay | Admitting: Obstetrics & Gynecology

## 2014-05-04 VITALS — BP 118/79 | HR 70 | Temp 98.3°F | Ht 68.0 in | Wt 156.0 lb

## 2014-05-04 DIAGNOSIS — Z23 Encounter for immunization: Secondary | ICD-10-CM

## 2014-05-04 DIAGNOSIS — N921 Excessive and frequent menstruation with irregular cycle: Secondary | ICD-10-CM

## 2014-05-04 NOTE — Progress Notes (Signed)
Patient ID: Michelle Petty, female   DOB: 23-Aug-1965, 48 y.o.   MRN: [id number]  Chief Complaint  Patient presents with  . Follow-up    HPI Michelle Petty is a 48 y.o. female.  She denies any prolonged/abnormal bleeding episodes.  Occasional missed menses.  HPI  Past Medical History  Diagnosis Date  . Breast cancer 01/21/13    left  . Hx of radiation therapy 04/26/13- 05/30/13    left breast 5000 cGy 25 sessions    Past Surgical History  Procedure Laterality Date  . Achilles tendon repair Left 1985  . Breast biopsy Left 01/21/13    malignant   . Cesarean section  2001  . Wisdom tooth extraction    . Breast lumpectomy with needle localization and axillary sentinel lymph node bx Left 02/23/2013    Procedure: BREAST LUMPECTOMY WITH NEEDLE LOCALIZATION AND AXILLARY SENTINEL LYMPH NODE BX;  Surgeon: Harl Bowie, MD;  Location: [place];  Service: General;  Laterality: Left;  . Excision of breast biopsy Left 03/18/2013    Procedure: Re-Excision of Breast Cancer, Lateral Margin;  Surgeon: Harl Bowie, MD;  Location: [place];  Service: General;  Laterality: Left;    Family History  Problem Relation Age of Onset  . Diabetes Mother   . Diabetes Maternal Grandmother     Social History History  Substance Use Topics  . Smoking status: Never Smoker   . Smokeless tobacco: Never Used  . Alcohol Use: No    No Known Allergies  Current Outpatient Prescriptions  Medication Sig Dispense Refill  . calcium-vitamin D (OSCAL WITH D) 500-200 MG-UNIT per tablet Take 1 tablet by mouth.      Marland Kitchen ibuprofen (ADVIL,MOTRIN) 600 MG tablet Take 600 mg by mouth every 6 (six) hours as needed for pain.      . tamoxifen (NOLVADEX) 20 MG tablet TAKE 1 TABLET (20 MG TOTAL) BY MOUTH DAILY.  30 tablet  3   No current facility-administered medications for this visit.    Review of Systems Review of Systems Constitutional: negative for fatigue and weight  loss Respiratory: negative for cough and wheezing Cardiovascular: negative for chest pain, fatigue and palpitations Gastrointestinal: negative for abdominal pain and change in bowel habits Genitourinary:negative Integument/breast: negative for nipple discharge Musculoskeletal:negative for myalgias Neurological: negative for gait problems and tremors Behavioral/Psych: negative for abusive relationship, depression Endocrine: negative for temperature intolerance     Blood pressure 118/79, pulse 70, temperature 98.3 F (36.8 C), height 5\' 8"  (1.727 m), weight 70.761 kg (156 lb), last menstrual period 03/14/2014.  Physical Exam Physical Exam   50% of 15 min visit spent on counseling and coordination of care.   Data Reviewed None  Assessment    Minimal side effects from the Tamoxifen at present     Plan     Possible management options include: Paragard IUD Follow up as needed.         JACKSON-MOORE,Elena Davia A 05/04/2014, 4:32 PM

## 2014-05-05 NOTE — Addendum Note (Signed)
Addended by: Carole Binning on: 05/05/2014 10:43 AM   Modules accepted: Orders, SmartSet

## 2014-05-29 ENCOUNTER — Encounter: Payer: Self-pay | Admitting: Obstetrics & Gynecology

## 2014-06-20 ENCOUNTER — Telehealth: Payer: Self-pay | Admitting: Hematology and Oncology

## 2014-06-20 NOTE — Telephone Encounter (Signed)
Faxed pt medical records to [facility] [phone]

## 2014-07-24 ENCOUNTER — Encounter: Payer: Self-pay | Admitting: *Deleted

## 2014-07-25 ENCOUNTER — Encounter: Payer: Self-pay | Admitting: Obstetrics & Gynecology

## 2014-08-25 ENCOUNTER — Other Ambulatory Visit: Payer: Self-pay | Admitting: *Deleted

## 2014-08-25 DIAGNOSIS — C50319 Malignant neoplasm of lower-inner quadrant of unspecified female breast: Secondary | ICD-10-CM

## 2014-08-25 DIAGNOSIS — C50919 Malignant neoplasm of unspecified site of unspecified female breast: Secondary | ICD-10-CM

## 2014-08-25 MED ORDER — TAMOXIFEN CITRATE 20 MG PO TABS: ORAL_TABLET | ORAL | Status: DC

## 2014-10-02 ENCOUNTER — Ambulatory Visit: Payer: Medicaid Other | Admitting: Hematology and Oncology

## 2014-10-02 ENCOUNTER — Telehealth: Payer: Self-pay | Admitting: Hematology and Oncology

## 2014-10-02 NOTE — Telephone Encounter (Signed)
Ret pt call as she lm that she had car trble and cannot keep todays appt,her appt has been r/s  [place]

## 2014-10-18 ENCOUNTER — Ambulatory Visit (HOSPITAL_BASED_OUTPATIENT_CLINIC_OR_DEPARTMENT_OTHER): Payer: Medicaid Other | Admitting: Hematology and Oncology

## 2014-10-18 ENCOUNTER — Telehealth: Payer: Self-pay | Admitting: Hematology and Oncology

## 2014-10-18 VITALS — BP 110/72 | HR 72 | Temp 98.1°F | Resp 18 | Ht 68.0 in | Wt 152.8 lb

## 2014-10-18 DIAGNOSIS — D0512 Intraductal carcinoma in situ of left breast: Secondary | ICD-10-CM | POA: Diagnosis not present

## 2014-10-18 DIAGNOSIS — C50319 Malignant neoplasm of lower-inner quadrant of unspecified female breast: Secondary | ICD-10-CM

## 2014-10-18 DIAGNOSIS — C50312 Malignant neoplasm of lower-inner quadrant of left female breast: Secondary | ICD-10-CM

## 2014-10-18 NOTE — Assessment & Plan Note (Signed)
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  Breast cancer surveillance: 1. Mammogram June 2015 were normal 2. Breast exam 10/18/2014 is normal  Patient was interested to find out at what age her daughter should start mammogram screening. I discussed with her that she could start her first mammogram after the age of 70. The intervals after that could be determined by her primary care physician. She is 49 years old and I do not believe that there is any benefit to doing mammogram screening at this age.  Return to clinic in 1 year for follow-up with labs.

## 2014-10-18 NOTE — Progress Notes (Signed)
Patient Care Team: Shelly Bombard, MD as PCP - General (Obstetrics and Gynecology)  DIAGNOSIS: Primary cancer of lower-inner quadrant of left female breast   Staging form: Breast, AJCC 7th Edition     Clinical: Stage IA (T1a, N0, cM0) - Unsigned       Staging comments: Staged in breast conference 7.9.14      Pathologic: No stage assigned - Unsigned   SUMMARY OF ONCOLOGIC HISTORY:   Primary cancer of lower-inner quadrant of left female breast   02/23/2013 Surgery Left breast lumpectomy: DCIS low-grade 0.4 cm, 3 sentinel nodes negative, ER 100%, EF 60%, HER-2 negative ratio 1.27, Ki-67 11%; initial biopsy showed invasive ductal carcinoma which was not found on the final lumpectomy specimen   04/26/2013 - 05/30/2013 Radiation Therapy Adjuvant radiation therapy   06/13/2013 -  Anti-estrogen oral therapy Tamoxifen 20 mg daily    CHIEF COMPLIANT: Follow-up of breast cancer and tamoxifen  INTERVAL HISTORY: Michelle Petty is a 49 year old lady with above-mentioned history of left-sided breast cancer treated with lumpectomy and adjuvant radiation is currently on tamoxifen 20 mg daily. She is tolerating tamoxifen extremely well without any major problems or concerns. She denies any lumps or nodules in the breasts.  REVIEW OF SYSTEMS:   Constitutional: Denies fevers, chills or abnormal weight loss Eyes: Denies blurriness of vision Ears, nose, mouth, throat, and face: Denies mucositis or sore throat Respiratory: Denies cough, dyspnea or wheezes Cardiovascular: Denies palpitation, chest discomfort or lower extremity swelling Gastrointestinal:  Denies nausea, heartburn or change in bowel habits Skin: Denies abnormal skin rashes Lymphatics: Denies new lymphadenopathy or easy bruising Neurological:Denies numbness, tingling or new weaknesses Behavioral/Psych: Mood is stable, no new changes  Breast:  denies any pain or lumps or nodules in either breasts All other systems were reviewed with the  patient and are negative.  I have reviewed the past medical history, past surgical history, social history and family history with the patient and they are unchanged from previous note.  ALLERGIES:  has No Known Allergies.  MEDICATIONS:  Current Outpatient Prescriptions  Medication Sig Dispense Refill  . calcium-vitamin D (OSCAL WITH D) 500-200 MG-UNIT per tablet Take 1 tablet by mouth.    Marland Kitchen ibuprofen (ADVIL,MOTRIN) 600 MG tablet Take 600 mg by mouth every 6 (six) hours as needed for pain.    . tamoxifen (NOLVADEX) 20 MG tablet TAKE 1 TABLET (20 MG TOTAL) BY MOUTH DAILY. 90 tablet 3   No current facility-administered medications for this visit.    PHYSICAL EXAMINATION: ECOG PERFORMANCE STATUS: 0 - Asymptomatic  Filed Vitals:   10/18/14 1101  BP: 110/72  Pulse: 72  Temp: 98.1 F (36.7 C)  Resp: 18   Filed Weights   10/18/14 1101  Weight: 152 lb 12.8 oz (69.31 kg)    GENERAL:alert, no distress and comfortable SKIN: skin color, texture, turgor are normal, no rashes or significant lesions EYES: normal, Conjunctiva are pink and non-injected, sclera clear OROPHARYNX:no exudate, no erythema and lips, buccal mucosa, and tongue normal  NECK: supple, thyroid normal size, non-tender, without nodularity LYMPH:  no palpable lymphadenopathy in the cervical, axillary or inguinal LUNGS: clear to auscultation and percussion with normal breathing effort HEART: regular rate & rhythm and no murmurs and no lower extremity edema ABDOMEN:abdomen soft, non-tender and normal bowel sounds Musculoskeletal:no cyanosis of digits and no clubbing  NEURO: alert & oriented x 3 with fluent speech, no focal motor/sensory deficits BREAST: No palpable masses or nodules in either right or left breasts. No  palpable axillary supraclavicular or infraclavicular adenopathy no breast tenderness or nipple discharge. (exam performed in the presence of a chaperone)  LABORATORY DATA:  I have reviewed the data as  listed   Chemistry      Component Value Date/Time   NA 139 03/16/2014 1357   NA 139 01/06/2013 1433   K 3.6 03/16/2014 1357   K 4.3 01/06/2013 1433   CL 104 01/06/2013 1433   CO2 22 03/16/2014 1357   CO2 27 01/06/2013 1433   BUN 8.4 03/16/2014 1357   BUN 7 01/06/2013 1433   CREATININE 0.9 03/16/2014 1357   CREATININE 0.86 01/06/2013 1433      Component Value Date/Time   CALCIUM 8.8 03/16/2014 1357   CALCIUM 9.1 01/06/2013 1433   ALKPHOS 36* 03/16/2014 1357   ALKPHOS 53 01/06/2013 1433   AST 15 03/16/2014 1357   AST 16 01/06/2013 1433   ALT 12 03/16/2014 1357   ALT 10 01/06/2013 1433   BILITOT 0.34 03/16/2014 1357   BILITOT 0.4 01/06/2013 1433       Lab Results  Component Value Date   WBC 7.2 03/16/2014   HGB 12.2 03/16/2014   HCT 35.1 03/16/2014   MCV 83.8 03/16/2014   PLT 209 03/16/2014   NEUTROABS 4.8 03/16/2014     RADIOGRAPHIC STUDIES: I have personally reviewed the radiology reports and agreed with their findings. 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  Breast cancer surveillance: 1. Mammogram June 2015 were normal 2. Breast exam 10/18/2014 is normal  Patient was interested to find out at what age her daughter should start mammogram screening. I discussed with her that she could start her first mammogram after the age of 22. The intervals after that could be determined by her primary care physician. She is 49 years old and I do not believe that there is any benefit to doing mammogram screening at this  age.  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  Return to clinic in 1 year for follow-up with labs.      Orders Placed This Encounter  Procedures  . CBC with Differential    Standing Status: Future     Number of Occurrences:      Standing Expiration Date: 10/18/2015  . Comprehensive metabolic panel (Cmet) - [facility]    Standing Status: Future     Number of Occurrences:      Standing Expiration Date: 10/18/2015   The patient has a good understanding of the overall plan. she agrees with it. She will call with any problems that may develop before her next visit here.   Rulon Eisenmenger, MD

## 2014-10-18 NOTE — Telephone Encounter (Signed)
per pof to sch pt appt-gave pt copy of sch °

## 2015-01-09 ENCOUNTER — Other Ambulatory Visit: Payer: Self-pay | Admitting: Hematology and Oncology

## 2015-01-09 DIAGNOSIS — Z853 Personal history of malignant neoplasm of breast: Secondary | ICD-10-CM

## 2015-01-24 ENCOUNTER — Ambulatory Visit: Payer: Medicaid Other | Admitting: Certified Nurse Midwife

## 2015-01-31 ENCOUNTER — Ambulatory Visit: Payer: Medicaid Other | Admitting: Certified Nurse Midwife

## 2015-02-14 ENCOUNTER — Encounter: Payer: Self-pay | Admitting: Certified Nurse Midwife

## 2015-02-14 ENCOUNTER — Ambulatory Visit (INDEPENDENT_AMBULATORY_CARE_PROVIDER_SITE_OTHER): Payer: Medicaid Other | Admitting: Certified Nurse Midwife

## 2015-02-14 VITALS — BP 116/72 | HR 74 | Temp 97.7°F | Ht 68.0 in | Wt 152.0 lb

## 2015-02-14 DIAGNOSIS — N926 Irregular menstruation, unspecified: Secondary | ICD-10-CM | POA: Diagnosis not present

## 2015-02-14 DIAGNOSIS — Z0000 Encounter for general adult medical examination without abnormal findings: Secondary | ICD-10-CM | POA: Diagnosis not present

## 2015-02-14 DIAGNOSIS — Z01419 Encounter for gynecological examination (general) (routine) without abnormal findings: Secondary | ICD-10-CM

## 2015-02-14 DIAGNOSIS — Z113 Encounter for screening for infections with a predominantly sexual mode of transmission: Secondary | ICD-10-CM | POA: Diagnosis not present

## 2015-02-14 LAB — CBC WITH DIFFERENTIAL/PLATELET
Basophils Absolute: 0 10*3/uL (ref 0.0–0.1)
Basophils Relative: 0 % (ref 0–1)
Eosinophils Absolute: 0.1 10*3/uL (ref 0.0–0.7)
Eosinophils Relative: 1 % (ref 0–5)
HCT: 34.5 % — ABNORMAL LOW (ref 36.0–46.0)
Hemoglobin: 11.7 g/dL — ABNORMAL LOW (ref 12.0–15.0)
Lymphocytes Relative: 25 % (ref 12–46)
Lymphs Abs: 1.9 10*3/uL (ref 0.7–4.0)
MCH: 29.2 pg (ref 26.0–34.0)
MCHC: 33.9 g/dL (ref 30.0–36.0)
MCV: 86 fL (ref 78.0–100.0)
MPV: 9.5 fL (ref 8.6–12.4)
Monocytes Absolute: 0.4 10*3/uL (ref 0.1–1.0)
Monocytes Relative: 6 % (ref 3–12)
NEUTROS ABS: 5 10*3/uL (ref 1.7–7.7)
Neutrophils Relative %: 68 % (ref 43–77)
PLATELETS: 201 10*3/uL (ref 150–400)
RBC: 4.01 MIL/uL (ref 3.87–5.11)
RDW: 13.8 % (ref 11.5–15.5)
WBC: 7.4 10*3/uL (ref 4.0–10.5)

## 2015-02-14 LAB — TRIGLYCERIDES: TRIGLYCERIDES: 87 mg/dL (ref <150)

## 2015-02-14 LAB — COMPREHENSIVE METABOLIC PANEL
ALBUMIN: 3.5 g/dL (ref 3.5–5.2)
ALK PHOS: 33 U/L — AB (ref 39–117)
ALT: 10 U/L (ref 0–35)
AST: 15 U/L (ref 0–37)
BILIRUBIN TOTAL: 0.4 mg/dL (ref 0.2–1.2)
BUN: 5 mg/dL — ABNORMAL LOW (ref 6–23)
CHLORIDE: 109 meq/L (ref 96–112)
CO2: 26 meq/L (ref 19–32)
Calcium: 8.7 mg/dL (ref 8.4–10.5)
Creat: 0.71 mg/dL (ref 0.50–1.10)
Glucose, Bld: 78 mg/dL (ref 70–99)
POTASSIUM: 3.8 meq/L (ref 3.5–5.3)
SODIUM: 143 meq/L (ref 135–145)
Total Protein: 6.6 g/dL (ref 6.0–8.3)

## 2015-02-14 LAB — CHOLESTEROL, TOTAL: CHOLESTEROL: 153 mg/dL (ref 0–200)

## 2015-02-14 LAB — TSH: TSH: 1.495 u[IU]/mL (ref 0.350–4.500)

## 2015-02-14 LAB — HDL CHOLESTEROL: HDL: 48 mg/dL (ref >=46)

## 2015-02-14 LAB — RPR

## 2015-02-14 NOTE — Progress Notes (Signed)
Patient ID: Michelle Petty, female   DOB: 1965/10/26, 49 y.o.   MRN: [id number]    Subjective:     Michelle Petty is a 49 y.o. female here for a routine exam.  Current complaints: irregular periods: last LMP was March 2nd, lasted 3-4 days.  Is having infrequent night sweats, denies any vaginal dryness.      Personal health questionnaire:  Is patient Ashkenazi Jewish, have a family history of breast and/or ovarian cancer: yes Is there a family history of uterine cancer diagnosed at age < 22, gastrointestinal cancer, urinary tract cancer, family member who is a Field seismologist syndrome-associated carrier: no Is the patient overweight and hypertensive, family history of diabetes, personal history of gestational diabetes, preeclampsia or PCOS: no Is patient over 28, have PCOS,  family history of premature CHD under age 29, diabetes, smoke, have hypertension or peripheral artery disease:  Yes, Mother has DM At any time, has a partner hit, kicked or otherwise hurt or frightened you?: no Over the past 2 weeks, have you felt down, depressed or hopeless?: no Over the past 2 weeks, have you felt little interest or pleasure in doing things?:no   Gynecologic History Patient's last menstrual period was 09/27/2014 (approximate). Contraception: condoms Last Pap: 01/06/13. Results were: normal Last mammogram: 01/23/14. 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 Breast cancer 01/21/13    left  . Hx of radiation therapy 04/26/13- 05/30/13    left breast 5000 cGy 25 sessions    Past Surgical History  Procedure Laterality Date  . Achilles tendon repair Left 1985  . Breast biopsy Left 01/21/13    malignant   .  Cesarean section  2001  . Wisdom tooth extraction    . Breast lumpectomy with needle localization and axillary sentinel lymph node bx Left 02/23/2013    Procedure: BREAST LUMPECTOMY WITH NEEDLE LOCALIZATION AND AXILLARY SENTINEL LYMPH NODE BX;  Surgeon: Harl Bowie, MD;  Location: [facility];  Service: General;  Laterality: Left;  . Excision of breast biopsy Left 03/18/2013    Procedure: Re-Excision of Breast Cancer, Lateral Margin;  Surgeon: Harl Bowie, MD;  Location: [facility];  Service: General;  Laterality: Left;     Current outpatient prescriptions:  .  calcium-vitamin D (OSCAL WITH D) 500-200 MG-UNIT per tablet, Take 1 tablet by mouth., Disp: , Rfl:  .  ibuprofen (ADVIL,MOTRIN) 600 MG tablet, Take 600 mg by mouth every 6 (six) hours as needed for pain., Disp: , Rfl:  .  tamoxifen (NOLVADEX) 20 MG tablet, TAKE 1 TABLET (20 MG TOTAL) BY MOUTH DAILY., Disp: 90 tablet, Rfl: 3 No Known Allergies  History  Substance Use Topics  . Smoking status: Never Smoker   . Smokeless tobacco: Never Used  . Alcohol Use: No    Family History  Problem Relation Age of Onset  . Diabetes Mother   . Diabetes Maternal Grandmother       Review of Systems  Constitutional: negative for  fatigue and weight loss Respiratory: negative for cough and wheezing Cardiovascular: negative for chest pain, fatigue and palpitations Gastrointestinal: negative for abdominal pain and change in bowel habits Musculoskeletal:negative for myalgias Neurological: negative for gait problems and tremors Behavioral/Psych: negative for abusive relationship, depression Endocrine: negative for temperature intolerance   Genitourinary:negative for abnormal menstrual periods, genital lesions, hot flashes, sexual problems and vaginal discharge Integument/breast: negative for breast lump, breast tenderness, nipple discharge and skin lesion(s)    Objective:       BP 116/72 mmHg  Pulse  74  Temp(Src) 97.7 F (36.5 C)  Ht 5\' 8"  (1.727 m)  Wt 152 lb (68.947 kg)  BMI 23.12 kg/m2  LMP 09/27/2014 (Approximate) General:   alert  Skin:   no rash or abnormalities  Lungs:   clear to auscultation bilaterally  Heart:   regular rate and rhythm, S1, S2 normal, no murmur, click, rub or gallop  Breasts:   normal without suspicious masses, skin or nipple changes or axillary nodes  Abdomen:  normal findings: no organomegaly, soft, non-tender and no hernia  Pelvis:  External genitalia: normal general appearance Urinary system: urethral meatus normal and bladder without fullness, nontender Vaginal: normal without tenderness, induration or masses Cervix: normal appearance, os is very small Adnexa: normal bimanual exam Uterus: retroverted and non-tender, palpable abnormality posterior wall towards cervix   Lab Review Urine pregnancy test Labs reviewed yes Radiologic studies reviewed yes  50% of 30 min visit spent on counseling and coordination of care.   Assessment:    Healthy female exam.   Contraception counseling STD screening BCA remission ? Uterine fibroid   Plan:    Education reviewed: calcium supplements, depression evaluation, low fat, low cholesterol diet, safe sex/STD prevention, self breast exams, skin cancer screening and weight bearing exercise. Contraception: condoms and considering paraguard IUD. Follow up in: 1 year and PRN if decides on paraguard IUD.   No orders of the defined types were placed in this encounter.   Orders Placed This Encounter  Procedures  . US Pelvis Complete    Standing Status: Future     Number of Occurrences:      Standing Expiration Date: 04/16/2016    Order Specific Question:  Reason for Exam (SYMPTOM  OR DIAGNOSIS REQUIRED)    Answer:  ?pelvic mass, irregular periods on Tamoxifen    Order Specific Question:  Preferred imaging location?    Answer:  [facility]  . US Transvaginal Non-OB    Standing Status: Future      Number of Occurrences:      Standing Expiration Date: 04/16/2016    Order Specific Question:  Reason for Exam (SYMPTOM  OR DIAGNOSIS REQUIRED)    Answer:  ?pelvic mass, irregular periods on Tamoxifen    Order Specific Question:  Preferred imaging location?    Answer:  [facility]  . HIV antibody (with reflex)  . Hepatitis B surface antigen  . RPR  . Hepatitis C antibody  . TSH  . Prolactin  . Comprehensive metabolic panel  . CBC with Differential/Platelet  . Cholesterol, total  . Triglycerides  . HDL cholesterol

## 2015-02-14 NOTE — Addendum Note (Signed)
Addended by: Lewie Loron D on: 02/14/2015 04:37 PM   Modules accepted: Orders

## 2015-02-15 LAB — PROLACTIN: Prolactin: 12.3 ng/mL

## 2015-02-15 LAB — HEPATITIS C ANTIBODY: HCV AB: NEGATIVE

## 2015-02-15 LAB — HEPATITIS B SURFACE ANTIGEN: HEP B S AG: NEGATIVE

## 2015-02-15 LAB — HIV ANTIBODY (ROUTINE TESTING W REFLEX): HIV: NONREACTIVE

## 2015-02-16 LAB — PAP IG AND HPV HIGH-RISK: HPV DNA HIGH RISK: NOT DETECTED

## 2015-02-18 LAB — SURESWAB, VAGINOSIS/VAGINITIS PLUS
Atopobium vaginae: 7.2 Log (cells/mL)
C. GLABRATA, DNA: NOT DETECTED
C. albicans, DNA: NOT DETECTED
C. parapsilosis, DNA: DETECTED — AB
C. trachomatis RNA, TMA: NOT DETECTED
C. tropicalis, DNA: NOT DETECTED
LACTOBACILLUS SPECIES: NOT DETECTED Log (cells/mL)
N. gonorrhoeae RNA, TMA: NOT DETECTED
T. VAGINALIS RNA, QL TMA: NOT DETECTED

## 2015-02-20 ENCOUNTER — Other Ambulatory Visit: Payer: Self-pay | Admitting: Certified Nurse Midwife

## 2015-02-20 DIAGNOSIS — N760 Acute vaginitis: Principal | ICD-10-CM

## 2015-02-20 DIAGNOSIS — B3731 Acute candidiasis of vulva and vagina: Secondary | ICD-10-CM

## 2015-02-20 DIAGNOSIS — B373 Candidiasis of vulva and vagina: Secondary | ICD-10-CM

## 2015-02-20 DIAGNOSIS — B9689 Other specified bacterial agents as the cause of diseases classified elsewhere: Secondary | ICD-10-CM

## 2015-02-20 MED ORDER — FLUCONAZOLE 100 MG PO TABS: 100.0000 mg | ORAL_TABLET | Freq: Once | ORAL | Status: DC

## 2015-02-20 MED ORDER — TERCONAZOLE 0.4 % VA CREA: 1.0000 | TOPICAL_CREAM | Freq: Every day | VAGINAL | Status: DC

## 2015-02-20 MED ORDER — TINIDAZOLE 500 MG PO TABS: 2.0000 g | ORAL_TABLET | Freq: Every day | ORAL | Status: AC

## 2015-02-21 ENCOUNTER — Ambulatory Visit
Admission: RE | Admit: 2015-02-21 | Discharge: 2015-02-21 | Disposition: A | Discharge status: Home / Self Care | Payer: Medicaid Other | Source: Ambulatory Visit | Attending: Hematology and Oncology | Admitting: Hematology and Oncology

## 2015-02-21 DIAGNOSIS — Z853 Personal history of malignant neoplasm of breast: Secondary | ICD-10-CM

## 2015-02-26 ENCOUNTER — Ambulatory Visit (HOSPITAL_COMMUNITY)
Admission: RE | Admit: 2015-02-26 | Discharge: 2015-02-26 | Disposition: A | Discharge status: Home / Self Care | Payer: Medicaid Other | Source: Ambulatory Visit | Attending: Certified Nurse Midwife | Admitting: Certified Nurse Midwife

## 2015-02-26 DIAGNOSIS — N926 Irregular menstruation, unspecified: Secondary | ICD-10-CM | POA: Diagnosis present

## 2015-02-26 DIAGNOSIS — Z79810 Long term (current) use of selective estrogen receptor modulators (SERMs): Secondary | ICD-10-CM | POA: Insufficient documentation

## 2015-02-26 DIAGNOSIS — Z01419 Encounter for gynecological examination (general) (routine) without abnormal findings: Secondary | ICD-10-CM

## 2015-02-26 DIAGNOSIS — N8320 Unspecified ovarian cysts: Secondary | ICD-10-CM | POA: Diagnosis not present

## 2015-02-26 DIAGNOSIS — Z113 Encounter for screening for infections with a predominantly sexual mode of transmission: Secondary | ICD-10-CM

## 2015-02-28 ENCOUNTER — Other Ambulatory Visit: Payer: Self-pay | Admitting: Certified Nurse Midwife

## 2015-02-28 DIAGNOSIS — N939 Abnormal uterine and vaginal bleeding, unspecified: Secondary | ICD-10-CM

## 2015-02-28 DIAGNOSIS — R9389 Abnormal findings on diagnostic imaging of other specified body structures: Secondary | ICD-10-CM

## 2015-03-06 ENCOUNTER — Other Ambulatory Visit: Payer: Self-pay | Admitting: Certified Nurse Midwife

## 2015-03-06 DIAGNOSIS — Z9889 Other specified postprocedural states: Secondary | ICD-10-CM

## 2015-03-06 MED ORDER — CYCLOBENZAPRINE HCL 10 MG PO TABS: 10.0000 mg | ORAL_TABLET | Freq: Three times a day (TID) | ORAL | Status: DC | PRN

## 2015-03-06 MED ORDER — TRAMADOL HCL 50 MG PO TABS: 50.0000 mg | ORAL_TABLET | Freq: Four times a day (QID) | ORAL | Status: AC | PRN

## 2015-03-06 MED ORDER — OXYCODONE-ACETAMINOPHEN 5-325 MG PO TABS: 1.0000 | ORAL_TABLET | ORAL | Status: DC | PRN

## 2015-03-06 NOTE — Progress Notes (Signed)
Spoke with patient over the phone regarding ultrasound results and plan of care. Patient verbalized understanding. Rx's for pain before procedure given.  Patient knows to have someone drive her to the appointment.  All questions answered.  Patient is scheduled for September 1st, 2016 at 0900.

## 2015-03-29 ENCOUNTER — Ambulatory Visit (INDEPENDENT_AMBULATORY_CARE_PROVIDER_SITE_OTHER): Payer: Medicaid Other

## 2015-03-29 ENCOUNTER — Encounter: Payer: Self-pay | Admitting: Obstetrics

## 2015-03-29 ENCOUNTER — Ambulatory Visit (INDEPENDENT_AMBULATORY_CARE_PROVIDER_SITE_OTHER): Payer: Medicaid Other | Admitting: Obstetrics

## 2015-03-29 ENCOUNTER — Other Ambulatory Visit: Payer: Self-pay | Admitting: Obstetrics

## 2015-03-29 ENCOUNTER — Other Ambulatory Visit: Payer: Self-pay | Admitting: Certified Nurse Midwife

## 2015-03-29 VITALS — BP 120/75 | HR 69 | Temp 98.1°F | Ht 68.0 in | Wt 153.2 lb

## 2015-03-29 DIAGNOSIS — R9389 Abnormal findings on diagnostic imaging of other specified body structures: Secondary | ICD-10-CM

## 2015-03-29 DIAGNOSIS — N939 Abnormal uterine and vaginal bleeding, unspecified: Secondary | ICD-10-CM

## 2015-03-29 DIAGNOSIS — Z01812 Encounter for preprocedural laboratory examination: Secondary | ICD-10-CM | POA: Diagnosis not present

## 2015-03-29 DIAGNOSIS — R938 Abnormal findings on diagnostic imaging of other specified body structures: Secondary | ICD-10-CM | POA: Diagnosis not present

## 2015-03-29 DIAGNOSIS — N921 Excessive and frequent menstruation with irregular cycle: Secondary | ICD-10-CM

## 2015-03-29 LAB — POCT URINE PREGNANCY: Preg Test, Ur: NEGATIVE

## 2015-03-30 ENCOUNTER — Encounter: Payer: Self-pay | Admitting: Obstetrics

## 2015-03-30 NOTE — Progress Notes (Signed)
SONOHYSTEROGRAM PROCEDURE NOTE  SONOHYSTEROGRAM PROCEDURE:   A time-out was performed confirming patient name, allergy status and procedure.  A UPT was negative.  A Graves speculum was placed in the vagina.  The cervix was prepped with betadine.  The cervix was grasped with a single tooth tenaculum.  The IUI catherter was primed with sterile water.  The IUI was introduced into the uterine cavity.  The single tooth tenaculum and speculum were removed.  The vaginal probe was placed.  The sterile water was instilled in the uterine cavity.    A 62ml syringe was then attached to the IUI catheter and negative pressure applied as IUI catheter was removed, thus obtaining an endometrial biopsy. The IUI was removed and the IUI aspirate was submitted to Pathology for evaluation. The single tooth tenaculum was removed with minimal bleeding noted from the cervix.  The patient tolerated the procedure well.  FINDINGS:  Normal appearing endometrial cavity.  No polyps or submucous fibroids observed.  Michelle Petty A. Jodi Mourning MD

## 2015-10-19 ENCOUNTER — Other Ambulatory Visit: Payer: Self-pay

## 2015-10-19 DIAGNOSIS — C50312 Malignant neoplasm of lower-inner quadrant of left female breast: Secondary | ICD-10-CM

## 2015-10-22 ENCOUNTER — Encounter: Payer: Self-pay | Admitting: Hematology and Oncology

## 2015-10-22 ENCOUNTER — Other Ambulatory Visit (HOSPITAL_BASED_OUTPATIENT_CLINIC_OR_DEPARTMENT_OTHER): Payer: Medicaid Other

## 2015-10-22 ENCOUNTER — Telehealth: Payer: Self-pay | Admitting: Hematology and Oncology

## 2015-10-22 ENCOUNTER — Ambulatory Visit (HOSPITAL_BASED_OUTPATIENT_CLINIC_OR_DEPARTMENT_OTHER): Payer: Medicaid Other | Admitting: Hematology and Oncology

## 2015-10-22 VITALS — BP 124/69 | HR 77 | Temp 98.1°F | Resp 18 | Wt 151.6 lb

## 2015-10-22 DIAGNOSIS — D0512 Intraductal carcinoma in situ of left breast: Secondary | ICD-10-CM

## 2015-10-22 DIAGNOSIS — Z170 Estrogen receptor positive status [ER+]: Secondary | ICD-10-CM | POA: Diagnosis not present

## 2015-10-22 DIAGNOSIS — C50312 Malignant neoplasm of lower-inner quadrant of left female breast: Secondary | ICD-10-CM

## 2015-10-22 DIAGNOSIS — Z79810 Long term (current) use of selective estrogen receptor modulators (SERMs): Secondary | ICD-10-CM

## 2015-10-22 DIAGNOSIS — C50319 Malignant neoplasm of lower-inner quadrant of unspecified female breast: Secondary | ICD-10-CM

## 2015-10-22 DIAGNOSIS — C50919 Malignant neoplasm of unspecified site of unspecified female breast: Secondary | ICD-10-CM

## 2015-10-22 LAB — CBC WITH DIFFERENTIAL/PLATELET
BASO%: 0.9 % (ref 0.0–2.0)
Basophils Absolute: 0.1 10*3/uL (ref 0.0–0.1)
EOS%: 1.2 % (ref 0.0–7.0)
Eosinophils Absolute: 0.1 10*3/uL (ref 0.0–0.5)
HEMATOCRIT: 36.9 % (ref 34.8–46.6)
HEMOGLOBIN: 12 g/dL (ref 11.6–15.9)
LYMPH#: 1.4 10*3/uL (ref 0.9–3.3)
LYMPH%: 22.2 % (ref 14.0–49.7)
MCH: 28.8 pg (ref 25.1–34.0)
MCHC: 32.6 g/dL (ref 31.5–36.0)
MCV: 88.2 fL (ref 79.5–101.0)
MONO#: 0.5 10*3/uL (ref 0.1–0.9)
MONO%: 7.9 % (ref 0.0–14.0)
NEUT#: 4.2 10*3/uL (ref 1.5–6.5)
NEUT%: 67.8 % (ref 38.4–76.8)
Platelets: 183 10*3/uL (ref 145–400)
RBC: 4.19 10*6/uL (ref 3.70–5.45)
RDW: 14.2 % (ref 11.2–14.5)
WBC: 6.1 10*3/uL (ref 3.9–10.3)

## 2015-10-22 LAB — COMPREHENSIVE METABOLIC PANEL
ALBUMIN: 3.2 g/dL — AB (ref 3.5–5.0)
ALK PHOS: 41 U/L (ref 40–150)
ALT: 11 U/L (ref 0–55)
AST: 14 U/L (ref 5–34)
Anion Gap: 6 mEq/L (ref 3–11)
BUN: 6.5 mg/dL — AB (ref 7.0–26.0)
CALCIUM: 8.4 mg/dL (ref 8.4–10.4)
CO2: 26 mEq/L (ref 22–29)
Chloride: 110 mEq/L — ABNORMAL HIGH (ref 98–109)
Creatinine: 0.8 mg/dL (ref 0.6–1.1)
Glucose: 108 mg/dl (ref 70–140)
Potassium: 4.1 mEq/L (ref 3.5–5.1)
Sodium: 142 mEq/L (ref 136–145)
Total Bilirubin: 0.32 mg/dL (ref 0.20–1.20)
Total Protein: 6.4 g/dL (ref 6.4–8.3)

## 2015-10-22 MED ORDER — TAMOXIFEN CITRATE 20 MG PO TABS: ORAL_TABLET | ORAL | Status: DC

## 2015-10-22 NOTE — Assessment & Plan Note (Signed)
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  Breast cancer surveillance: 1. Mammogram 02/15/2015 were normal 2. 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  Return to clinic in 1 year for follow-up with labs.

## 2015-10-22 NOTE — Progress Notes (Signed)
Patient Care Team: Shelly Bombard, MD as PCP - General (Obstetrics and Gynecology)  DIAGNOSIS: Primary cancer of lower-inner quadrant of left female breast [facility])   Staging form: Breast, AJCC 7th Edition     Clinical: Stage IA (T1a, N0, cM0) - Unsigned       Staging comments: Staged in breast conference 7.9.14      Pathologic: No stage assigned - Unsigned   SUMMARY OF ONCOLOGIC HISTORY:   Primary cancer of lower-inner quadrant of left female breast ([place])   02/23/2013 Surgery Left breast lumpectomy: DCIS low-grade 0.4 cm, 3 sentinel nodes negative, ER 100%, EF 60%, HER-2 negative ratio 1.27, Ki-67 11%; initial biopsy showed invasive ductal carcinoma which was not found on the final lumpectomy specimen   04/26/2013 - 05/30/2013 Radiation Therapy Adjuvant radiation therapy   06/13/2013 -  Anti-estrogen oral therapy Tamoxifen 20 mg daily    CHIEF COMPLIANT: Follow-up on tamoxifen therapy  INTERVAL HISTORY: Michelle Petty is a 50 year old with above-mentioned C left breast cancer treated with lumpectomy and adjuvant radiation is currently on tamoxifen therapy since November 2014. She is here for annual follow-up. She reports no new problems or concerns. She denies any lumps or nodules in the breasts. She had a mammogram of 02/21/2015 which did not show any evidence of malignancy.  REVIEW OF SYSTEMS:   Constitutional: Denies fevers, chills or abnormal weight loss Eyes: Denies blurriness of vision Ears, nose, mouth, throat, and face: Denies mucositis or sore throat Respiratory: Denies cough, dyspnea or wheezes Cardiovascular: Denies palpitation, chest discomfort Gastrointestinal:  Denies nausea, heartburn or change in bowel habits Skin: Denies abnormal skin rashes Lymphatics: Denies new lymphadenopathy or easy bruising Neurological:Denies numbness, tingling or new weaknesses Behavioral/Psych: Mood is stable, no new changes  Extremities: No lower extremity edema Breast:  denies any pain  or lumps or nodules in either breasts All other systems were reviewed with the patient and are negative.  I have reviewed the past medical history, past surgical history, social history and family history with the patient and they are unchanged from previous note.  ALLERGIES:  has No Known Allergies.  MEDICATIONS:  Current Outpatient Prescriptions  Medication Sig Dispense Refill  . calcium-vitamin D (OSCAL WITH D) 500-200 MG-UNIT per tablet Take 1 tablet by mouth.    . cyclobenzaprine (FLEXERIL) 10 MG tablet Take 1 tablet (10 mg total) by mouth 3 (three) times daily as needed for muscle spasms. 30 tablet 0  . fluconazole (DIFLUCAN) 100 MG tablet Take 1 tablet (100 mg total) by mouth once. Repeat dose in 48-72 hour. (Patient not taking: Reported on 03/29/2015) 3 tablet 0  . ibuprofen (ADVIL,MOTRIN) 600 MG tablet Take 600 mg by mouth every 6 (six) hours as needed for pain.    Marland Kitchen oxyCODONE-acetaminophen (PERCOCET/ROXICET) 5-325 MG per tablet Take 1 tablet by mouth every 4 (four) hours as needed for severe pain. 20 tablet 0  . tamoxifen (NOLVADEX) 20 MG tablet TAKE 1 TABLET (20 MG TOTAL) BY MOUTH DAILY. 90 tablet 3  . terconazole (TERAZOL 7) 0.4 % vaginal cream Place 1 applicator vaginally at bedtime. 45 g 0  . traMADol (ULTRAM) 50 MG tablet Take 1 tablet (50 mg total) by mouth every 6 (six) hours as needed. 20 tablet 0   No current facility-administered medications for this visit.    PHYSICAL EXAMINATION: ECOG PERFORMANCE STATUS: 1 - Symptomatic but completely ambulatory  Filed Vitals:   10/22/15 1120  BP: 124/69  Pulse: 77  Temp: 98.1 F (36.7 C)  Resp: 18   Filed Weights   10/22/15 1120  Weight: 151 lb 9.6 oz (68.765 kg)    GENERAL:alert, no distress and comfortable SKIN: skin color, texture, turgor are normal, no rashes or significant lesions EYES: normal, Conjunctiva are pink and non-injected, sclera clear OROPHARYNX:no exudate, no erythema and lips, buccal mucosa, and tongue  normal  NECK: supple, thyroid normal size, non-tender, without nodularity LYMPH:  no palpable lymphadenopathy in the cervical, axillary or inguinal LUNGS: clear to auscultation and percussion with normal breathing effort HEART: regular rate & rhythm and no murmurs and no lower extremity edema ABDOMEN:abdomen soft, non-tender and normal bowel sounds MUSCULOSKELETAL:no cyanosis of digits and no clubbing  NEURO: alert & oriented x 3 with fluent speech, no focal motor/sensory deficits EXTREMITIES: No lower extremity edema BREAST: No palpable masses or nodules in either right or left breasts. No palpable axillary supraclavicular or infraclavicular adenopathy no breast tenderness or nipple discharge. (exam performed in the presence of a chaperone)  LABORATORY DATA:  I have reviewed the data as listed   Chemistry      Component Value Date/Time   NA 143 02/14/2015 1633   NA 139 03/16/2014 1357   K 3.8 02/14/2015 1633   K 3.6 03/16/2014 1357   CL 109 02/14/2015 1633   CO2 26 02/14/2015 1633   CO2 22 03/16/2014 1357   BUN 5* 02/14/2015 1633   BUN 8.4 03/16/2014 1357   CREATININE 0.71 02/14/2015 1633   CREATININE 0.9 03/16/2014 1357      Component Value Date/Time   CALCIUM 8.7 02/14/2015 1633   CALCIUM 8.8 03/16/2014 1357   ALKPHOS 33* 02/14/2015 1633   ALKPHOS 36* 03/16/2014 1357   AST 15 02/14/2015 1633   AST 15 03/16/2014 1357   ALT 10 02/14/2015 1633   ALT 12 03/16/2014 1357   BILITOT 0.4 02/14/2015 1633   BILITOT 0.34 03/16/2014 1357       Lab Results  Component Value Date   WBC 6.1 10/22/2015   HGB 12.0 10/22/2015   HCT 36.9 10/22/2015   MCV 88.2 10/22/2015   PLT 183 10/22/2015   NEUTROABS 4.2 10/22/2015   ASSESSMENT & PLAN:  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 Very occasional hot flashes.  Breast cancer surveillance: 1. Mammogram 02/15/2015 were normal 2. 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  Return to clinic in 1 year for follow-up with labs.    No orders of the defined types were placed in this encounter.   The patient has a good understanding of the overall plan. she agrees with it. she will call with any problems that may develop before the next visit here.   Rulon Eisenmenger, MD 10/22/2015

## 2015-10-22 NOTE — Progress Notes (Signed)
Unable to get in to exam room prior to MD.  No assessment performed.  

## 2015-10-22 NOTE — Telephone Encounter (Signed)
Gave patient avs report and appointments for March 2018.  °

## 2016-02-18 ENCOUNTER — Other Ambulatory Visit: Payer: Self-pay | Admitting: Hematology and Oncology

## 2016-02-18 DIAGNOSIS — Z853 Personal history of malignant neoplasm of breast: Secondary | ICD-10-CM

## 2016-02-25 ENCOUNTER — Ambulatory Visit
Admission: RE | Admit: 2016-02-25 | Discharge: 2016-02-25 | Disposition: A | Discharge status: Home / Self Care | Payer: Medicaid Other | Source: Ambulatory Visit | Attending: Hematology and Oncology | Admitting: Hematology and Oncology

## 2016-02-25 DIAGNOSIS — Z853 Personal history of malignant neoplasm of breast: Secondary | ICD-10-CM

## 2016-04-30 ENCOUNTER — Encounter: Payer: Self-pay | Admitting: Gastroenterology

## 2016-06-02 ENCOUNTER — Ambulatory Visit (AMBULATORY_SURGERY_CENTER): Payer: Self-pay | Admitting: *Deleted

## 2016-06-02 VITALS — Ht 68.0 in | Wt 153.0 lb

## 2016-06-02 DIAGNOSIS — Z1211 Encounter for screening for malignant neoplasm of colon: Secondary | ICD-10-CM

## 2016-06-02 MED ORDER — NA SULFATE-K SULFATE-MG SULF 17.5-3.13-1.6 GM/177ML PO SOLN: ORAL | 0 refills | Status: DC

## 2016-06-02 NOTE — Progress Notes (Signed)
Patient denies any allergies to eggs or soy. Patient denies any problems with anesthesia/sedation. Patient denies any oxygen use at home and does not take any diet/weight loss medications.  

## 2016-06-16 ENCOUNTER — Ambulatory Visit (AMBULATORY_SURGERY_CENTER): Payer: Medicaid Other | Admitting: Gastroenterology

## 2016-06-16 ENCOUNTER — Encounter: Payer: Self-pay | Admitting: Gastroenterology

## 2016-06-16 VITALS — BP 117/53 | HR 60 | Temp 96.9°F | Resp 16 | Ht 68.0 in | Wt 153.0 lb

## 2016-06-16 DIAGNOSIS — D123 Benign neoplasm of transverse colon: Secondary | ICD-10-CM

## 2016-06-16 DIAGNOSIS — Z1211 Encounter for screening for malignant neoplasm of colon: Secondary | ICD-10-CM | POA: Diagnosis not present

## 2016-06-16 DIAGNOSIS — Z1212 Encounter for screening for malignant neoplasm of rectum: Secondary | ICD-10-CM | POA: Diagnosis not present

## 2016-06-16 DIAGNOSIS — K635 Polyp of colon: Secondary | ICD-10-CM | POA: Diagnosis not present

## 2016-06-16 MED ORDER — SODIUM CHLORIDE 0.9 % IV SOLN: 500.0000 mL | INTRAVENOUS | Status: DC

## 2016-06-16 NOTE — Patient Instructions (Signed)
YOU HAD AN ENDOSCOPIC PROCEDURE TODAY AT THE [facility]:   Refer to the procedure report that was given to you for any specific questions about what was found during the examination.  If the procedure report does not answer your questions, please call your gastroenterologist to clarify.  If you requested that your care partner not be given the details of your procedure findings, then the procedure report has been included in a sealed envelope for you to review at your convenience later.  YOU SHOULD EXPECT: Some feelings of bloating in the abdomen. Passage of more gas than usual.  Walking can help get rid of the air that was put into your GI tract during the procedure and reduce the bloating. If you had a lower endoscopy (such as a colonoscopy or flexible sigmoidoscopy) you may notice spotting of blood in your stool or on the toilet paper. If you underwent a bowel prep for your procedure, you may not have a normal bowel movement for a few days.  Please Note:  You might notice some irritation and congestion in your nose or some drainage.  This is from the oxygen used during your procedure.  There is no need for concern and it should clear up in a day or so.  SYMPTOMS TO REPORT IMMEDIATELY:   Following lower endoscopy (colonoscopy or flexible sigmoidoscopy):  Excessive amounts of blood in the stool  Significant tenderness or worsening of abdominal pains  Swelling of the abdomen that is new, acute  Fever of 100F or higher    For urgent or emergent issues, a gastroenterologist can be reached at any hour by calling [phone].   DIET:  We do recommend a small meal at first, but then you may proceed to your regular diet.  Drink plenty of fluids but you should avoid alcoholic beverages for 24 hours.  ACTIVITY:  You should plan to take it easy for the rest of today and you should NOT DRIVE or use heavy machinery until tomorrow (because of the sedation medicines used during the test).     FOLLOW UP: Our staff will call the number listed on your records the next business day following your procedure to check on you and address any questions or concerns that you may have regarding the information given to you following your procedure. If we do not reach you, we will leave a message.  However, if you are feeling well and you are not experiencing any problems, there is no need to return our call.  We will assume that you have returned to your regular daily activities without incident.  If any biopsies were taken you will be contacted by phone or by letter within the next 1-3 weeks.  Please call us at [phone] if you have not heard about the biopsies in 3 weeks.    SIGNATURES/CONFIDENTIALITY: You and/or your care partner have signed paperwork which will be entered into your electronic medical record.  These signatures attest to the fact that that the information above on your After Visit Summary has been reviewed and is understood.  Full responsibility of the confidentiality of this discharge information lies with you and/or your care-partner.   Resume medications. Information given on polyps and hemorrhoids. 

## 2016-06-16 NOTE — Op Note (Signed)
[facility] Patient Name: Michelle Petty Procedure Date: 06/16/2016 1:23 PM MRN: [id number] Endoscopist: Mauri Pole , MD Age: 50 Referring MD:  Date of Birth: [birth year] Gender: Female Account #: [id number] Procedure:                Colonoscopy Indications:              Screening for colorectal malignant neoplasm, This                            is the patient's first colonoscopy Medicines:                Monitored Anesthesia Care Procedure:                Pre-Anesthesia Assessment:                           - Prior to the procedure, a History and Physical                            was performed, and patient medications and                            allergies were reviewed. The patient's tolerance of                            previous anesthesia was also reviewed. The risks                            and benefits of the procedure and the sedation                            options and risks were discussed with the patient.                            All questions were answered, and informed consent                            was obtained. Prior Anticoagulants: The patient has                            taken no previous anticoagulant or antiplatelet                            agents. ASA Grade Assessment: II - A patient with                            mild systemic disease. After reviewing the risks                            and benefits, the patient was deemed in                            satisfactory condition to undergo the procedure.  After obtaining informed consent, the colonoscope                            was passed under direct vision. Throughout the                            procedure, the patient's blood pressure, pulse, and                            oxygen saturations were monitored continuously. The                            Model CF-HQ190L [id number]) scope was introduced                            through the anus and  advanced to the the terminal                            ileum, with identification of the appendiceal                            orifice and IC valve. The colonoscopy was performed                            without difficulty. The patient tolerated the                            procedure well. The quality of the bowel                            preparation was excellent. The terminal ileum,                            ileocecal valve, appendiceal orifice, and rectum                            were photographed. Scope In: 1:34:14 PM Scope Out: 1:52:04 PM Scope Withdrawal Time: 0 hours 13 minutes 46 seconds  Total Procedure Duration: 0 hours 17 minutes 50 seconds  Findings:                 The perianal and digital rectal examinations were                            normal.                           A 4 mm polyp was found in the transverse colon. The                            polyp was sessile. The polyp was removed with a                            cold snare. Resection and retrieval were complete.  Non-bleeding internal hemorrhoids were found during                            retroflexion. The hemorrhoids were small. Complications:            No immediate complications. Estimated Blood Loss:     Estimated blood loss was minimal. Impression:               - One 4 mm polyp in the transverse colon, removed                            with a cold snare. Resected and retrieved.                           - Non-bleeding internal hemorrhoids. Recommendation:           - Patient has a contact number available for                            emergencies. The signs and symptoms of potential                            delayed complications were discussed with the                            patient. Return to normal activities tomorrow.                            Written discharge instructions were provided to the                            patient.                            - Resume previous diet.                           - Continue present medications.                           - Await pathology results.                           - Repeat colonoscopy in 5 years for surveillance                            based on pathology results. Mauri Pole, MD 06/16/2016 2:01:07 PM This report has been signed electronically.

## 2016-06-16 NOTE — Progress Notes (Signed)
A/ox3, pleased with MAC, report to RN 

## 2016-06-17 ENCOUNTER — Telehealth: Payer: Self-pay | Admitting: *Deleted

## 2016-06-17 NOTE — Telephone Encounter (Signed)
  Follow up Call-  Call back number 06/16/2016  Post procedure Call Back phone  # [phone]  Permission to leave phone message Yes  Some recent data might be hidden     Patient questions:  Do you have a fever, pain , or abdominal swelling? No. Pain Score  0 *  Have you tolerated food without any problems? Yes.    Have you been able to return to your normal activities? Yes.    Do you have any questions about your discharge instructions: Diet   No. Medications  No. Follow up visit  No.  Do you have questions or concerns about your Care? No.  Actions: * If pain score is 4 or above: No action needed, pain <4.

## 2016-06-24 ENCOUNTER — Encounter: Payer: Self-pay | Admitting: Gastroenterology

## 2016-10-20 ENCOUNTER — Encounter: Payer: Self-pay | Admitting: Hematology and Oncology

## 2016-10-20 ENCOUNTER — Ambulatory Visit (HOSPITAL_BASED_OUTPATIENT_CLINIC_OR_DEPARTMENT_OTHER): Payer: Medicaid Other | Admitting: Hematology and Oncology

## 2016-10-20 DIAGNOSIS — C50312 Malignant neoplasm of lower-inner quadrant of left female breast: Secondary | ICD-10-CM | POA: Diagnosis present

## 2016-10-20 MED ORDER — TAMOXIFEN CITRATE 20 MG PO TABS: ORAL_TABLET | ORAL | 3 refills | Status: DC

## 2016-10-20 NOTE — Progress Notes (Signed)
Patient Care Team: Barrie Lyme, FNP as PCP - General (Nurse Practitioner)  DIAGNOSIS:  Encounter Diagnosis  Name Primary?  . Primary cancer of lower-inner quadrant of left female breast ([place])     SUMMARY OF ONCOLOGIC HISTORY:   Primary cancer of lower-inner quadrant of left female breast ([place])   02/23/2013 Surgery    Left breast lumpectomy: DCIS low-grade 0.4 cm, 3 sentinel nodes negative, ER 100%, EF 60%, HER-2 negative ratio 1.27, Ki-67 11%; initial biopsy showed invasive ductal carcinoma which was not found on the final lumpectomy specimen      04/26/2013 - 05/30/2013 Radiation Therapy    Adjuvant radiation therapy      06/13/2013 -  Anti-estrogen oral therapy    Tamoxifen 20 mg daily       CHIEF COMPLIANT: Follow-up on tamoxifen therapy  INTERVAL HISTORY: Michelle Petty is a 51 year old with above-mentioned history left breast cancer treated with lumpectomy and radiation is currently on tamoxifen since November 2014. She is tolerating it extremely well. She does not have any hot flashes. Denies any myalgias. Denies any lumps or nodules in the breast.  REVIEW OF SYSTEMS:   Constitutional: Denies fevers, chills or abnormal weight loss Eyes: Denies blurriness of vision Ears, nose, mouth, throat, and face: Denies mucositis or sore throat Respiratory: Denies cough, dyspnea or wheezes Cardiovascular: Denies palpitation, chest discomfort Gastrointestinal:  Denies nausea, heartburn or change in bowel habits Skin: Denies abnormal skin rashes Lymphatics: Denies new lymphadenopathy or easy bruising Neurological:Denies numbness, tingling or new weaknesses Behavioral/Psych: Mood is stable, no new changes  Extremities: No lower extremity edema Breast:  denies any pain or lumps or nodules in either breasts All other systems were reviewed with the patient and are negative.  I have reviewed the past medical history, past surgical history, social history and family history with  the patient and they are unchanged from previous note.  ALLERGIES:  has No Known Allergies.  MEDICATIONS:  Current Outpatient Prescriptions  Medication Sig Dispense Refill  . calcium-vitamin D (OSCAL WITH D) 500-200 MG-UNIT per tablet Take 1 tablet by mouth.    Marland Kitchen ibuprofen (ADVIL,MOTRIN) 600 MG tablet Take 600 mg by mouth every 6 (six) hours as needed for pain.    . tamoxifen (NOLVADEX) 20 MG tablet TAKE 1 TABLET (20 MG TOTAL) BY MOUTH DAILY. 90 tablet 3   Current Facility-Administered Medications  Medication Dose Route Frequency Provider Last Rate Last Dose  . 0.9 %  sodium chloride infusion  500 mL Intravenous Continuous Kavitha Nandigam V, MD        PHYSICAL EXAMINATION: ECOG PERFORMANCE STATUS: 0 - Asymptomatic  Vitals:   10/20/16 1123  BP: 117/75  Pulse: 68  Resp: 18  Temp: 98 F (36.7 C)   Filed Weights   10/20/16 1123  Weight: 152 lb (68.9 kg)    GENERAL:alert, no distress and comfortable SKIN: skin color, texture, turgor are normal, no rashes or significant lesions EYES: normal, Conjunctiva are pink and non-injected, sclera clear OROPHARYNX:no exudate, no erythema and lips, buccal mucosa, and tongue normal  NECK: supple, thyroid normal size, non-tender, without nodularity LYMPH:  no palpable lymphadenopathy in the cervical, axillary or inguinal LUNGS: clear to auscultation and percussion with normal breathing effort HEART: regular rate & rhythm and no murmurs and no lower extremity edema ABDOMEN:abdomen soft, non-tender and normal bowel sounds MUSCULOSKELETAL:no cyanosis of digits and no clubbing  NEURO: alert & oriented x 3 with fluent speech, no focal motor/sensory deficits EXTREMITIES: No lower extremity  edema BREAST: No palpable masses or nodules in either right or left breasts. No palpable axillary supraclavicular or infraclavicular adenopathy no breast tenderness or nipple discharge. (exam performed in the presence of a chaperone)  LABORATORY DATA:  I have  reviewed the data as listed   Chemistry      Component Value Date/Time   NA 142 10/22/2015 1109   K 4.1 10/22/2015 1109   CL 109 02/14/2015 1633   CO2 26 10/22/2015 1109   BUN 6.5 (L) 10/22/2015 1109   CREATININE 0.8 10/22/2015 1109      Component Value Date/Time   CALCIUM 8.4 10/22/2015 1109   ALKPHOS 41 10/22/2015 1109   AST 14 10/22/2015 1109   ALT 11 10/22/2015 1109   BILITOT 0.32 10/22/2015 1109       Lab Results  Component Value Date   WBC 6.1 10/22/2015   HGB 12.0 10/22/2015   HCT 36.9 10/22/2015   MCV 88.2 10/22/2015   PLT 183 10/22/2015   NEUTROABS 4.2 10/22/2015    ASSESSMENT & PLAN:  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 Very occasional hot flashes.  Breast cancer surveillance: 1. Mammogram 02/15/2016 were normal 2. 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  Return to clinic in 1 year for follow-up with labs.    I spent 15 minutes talking to the patient of which more than half was spent in counseling and coordination of care.  No orders of the defined types were placed in this encounter.  The patient has a good understanding of the overall plan. she agrees with it. she will call with any problems that may develop before the next visit here.   Rulon Eisenmenger, MD 10/20/16

## 2016-10-20 NOTE — Assessment & Plan Note (Signed)
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 Very occasional hot flashes.  Breast cancer surveillance: 1. Mammogram 02/15/2016 were normal 2. 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  Return to clinic in 1 year for follow-up with labs.

## 2017-03-13 ENCOUNTER — Telehealth: Payer: Self-pay

## 2017-03-13 NOTE — Telephone Encounter (Signed)
Called and left a message with new appts due to pal day  Michelle Petty

## 2017-04-01 ENCOUNTER — Other Ambulatory Visit: Payer: Self-pay | Admitting: Hematology and Oncology

## 2017-04-01 DIAGNOSIS — Z853 Personal history of malignant neoplasm of breast: Secondary | ICD-10-CM

## 2017-04-06 ENCOUNTER — Ambulatory Visit
Admission: RE | Admit: 2017-04-06 | Discharge: 2017-04-06 | Disposition: A | Discharge status: Home / Self Care | Payer: Medicaid Other | Source: Ambulatory Visit | Attending: Hematology and Oncology | Admitting: Hematology and Oncology

## 2017-04-06 DIAGNOSIS — Z853 Personal history of malignant neoplasm of breast: Secondary | ICD-10-CM

## 2017-04-06 HISTORY — DX: Personal history of irradiation: Z92.3

## 2017-10-19 ENCOUNTER — Ambulatory Visit: Payer: Medicaid Other | Admitting: Hematology and Oncology

## 2017-10-20 ENCOUNTER — Ambulatory Visit: Payer: Medicaid Other | Admitting: Hematology and Oncology

## 2017-10-26 ENCOUNTER — Telehealth: Payer: Self-pay | Admitting: Hematology and Oncology

## 2017-10-26 ENCOUNTER — Inpatient Hospital Stay: Payer: Self-pay | Attending: Hematology and Oncology | Admitting: Hematology and Oncology

## 2017-10-26 DIAGNOSIS — Z170 Estrogen receptor positive status [ER+]: Secondary | ICD-10-CM | POA: Insufficient documentation

## 2017-10-26 DIAGNOSIS — Z79810 Long term (current) use of selective estrogen receptor modulators (SERMs): Secondary | ICD-10-CM | POA: Insufficient documentation

## 2017-10-26 DIAGNOSIS — M2550 Pain in unspecified joint: Secondary | ICD-10-CM | POA: Insufficient documentation

## 2017-10-26 DIAGNOSIS — C50312 Malignant neoplasm of lower-inner quadrant of left female breast: Secondary | ICD-10-CM | POA: Insufficient documentation

## 2017-10-26 DIAGNOSIS — Z923 Personal history of irradiation: Secondary | ICD-10-CM | POA: Insufficient documentation

## 2017-10-26 DIAGNOSIS — R232 Flushing: Secondary | ICD-10-CM | POA: Insufficient documentation

## 2017-10-26 MED ORDER — TAMOXIFEN CITRATE 20 MG PO TABS: ORAL_TABLET | ORAL | 3 refills | Status: DC

## 2017-10-26 NOTE — Progress Notes (Signed)
Patient Care Team: Barrie Lyme, FNP as PCP - General (Nurse Practitioner)  DIAGNOSIS:  Encounter Diagnosis  Name Primary?  . Primary cancer of lower-inner quadrant of left female breast ([place])     SUMMARY OF ONCOLOGIC HISTORY:   Primary cancer of lower-inner quadrant of left female breast ([place])   02/23/2013 Surgery    Left breast lumpectomy: DCIS low-grade 0.4 cm, 3 sentinel nodes negative, ER 100%, EF 60%, HER-2 negative ratio 1.27, Ki-67 11%; initial biopsy showed invasive ductal carcinoma which was not found on the final lumpectomy specimen      04/26/2013 - 05/30/2013 Radiation Therapy    Adjuvant radiation therapy      06/13/2013 -  Anti-estrogen oral therapy    Tamoxifen 20 mg daily       CHIEF COMPLIANT: Follow-up on tamoxifen therapy  INTERVAL HISTORY: Michelle Petty is a 52 year old with above-mentioned history left breast cancer treated with lumpectomy radiation is currently on tamoxifen therapy.  She appears to be terminal tolerating tamoxifen reasonably well.  She has very occasional hot flashes and intermittent joint pains.  But the joint pains could be related to her physical activity than related to tamoxifen.  Denies any lumps or nodules in the breast. Mammograms in September 2018 were normal.  REVIEW OF SYSTEMS:   Constitutional: Denies fevers, chills or abnormal weight loss Eyes: Denies blurriness of vision Ears, nose, mouth, throat, and face: Denies mucositis or sore throat Respiratory: Denies cough, dyspnea or wheezes Cardiovascular: Denies palpitation, chest discomfort Gastrointestinal:  Denies nausea, heartburn or change in bowel habits Skin: Denies abnormal skin rashes Lymphatics: Denies new lymphadenopathy or easy bruising Neurological:Denies numbness, tingling or new weaknesses Behavioral/Psych: Mood is stable, no new changes  Extremities: No lower extremity edema Breast:  denies any pain or lumps or nodules in either breasts All other systems  were reviewed with the patient and are negative.  I have reviewed the past medical history, past surgical history, social history and family history with the patient and they are unchanged from previous note.  ALLERGIES:  has No Known Allergies.  MEDICATIONS:  Current Outpatient Medications  Medication Sig Dispense Refill  . calcium-vitamin D (OSCAL WITH D) 500-200 MG-UNIT per tablet Take 1 tablet by mouth.    Marland Kitchen ibuprofen (ADVIL,MOTRIN) 600 MG tablet Take 600 mg by mouth every 6 (six) hours as needed for pain.    . tamoxifen (NOLVADEX) 20 MG tablet TAKE 1 TABLET (20 MG TOTAL) BY MOUTH DAILY. 90 tablet 3   Current Facility-Administered Medications  Medication Dose Route Frequency Provider Last Rate Last Dose  . 0.9 %  sodium chloride infusion  500 mL Intravenous Continuous Nandigam, Kavitha V, MD        PHYSICAL EXAMINATION: ECOG PERFORMANCE STATUS: 0 - Asymptomatic  Vitals:   10/26/17 1456  BP: 118/73  Pulse: 72  Resp: 18  Temp: 98.7 F (37.1 C)  SpO2: 99%   Filed Weights   10/26/17 1456  Weight: 149 lb 12.8 oz (67.9 kg)    GENERAL:alert, no distress and comfortable SKIN: skin color, texture, turgor are normal, no rashes or significant lesions EYES: normal, Conjunctiva are pink and non-injected, sclera clear OROPHARYNX:no exudate, no erythema and lips, buccal mucosa, and tongue normal  NECK: supple, thyroid normal size, non-tender, without nodularity LYMPH:  no palpable lymphadenopathy in the cervical, axillary or inguinal LUNGS: clear to auscultation and percussion with normal breathing effort HEART: regular rate & rhythm and no murmurs and no lower extremity edema ABDOMEN:abdomen soft,  non-tender and normal bowel sounds MUSCULOSKELETAL:no cyanosis of digits and no clubbing  NEURO: alert & oriented x 3 with fluent speech, no focal motor/sensory deficits EXTREMITIES: No lower extremity edema BREAST: No palpable masses or nodules in either right or left breasts. No  palpable axillary supraclavicular or infraclavicular adenopathy no breast tenderness or nipple discharge. (exam performed in the presence of a chaperone)  LABORATORY DATA:  I have reviewed the data as listed CMP Latest Ref Rng & Units 10/22/2015 02/14/2015 03/16/2014  Glucose 70 - 140 mg/dl 108 78 94  BUN 7.0 - 26.0 mg/dL 6.5(L) 5(L) 8.4  Creatinine 0.6 - 1.1 mg/dL 0.8 0.71 0.9  Sodium 136 - 145 mEq/L 142 143 139  Potassium 3.5 - 5.1 mEq/L 4.1 3.8 3.6  Chloride 96 - 112 mEq/L - 109 -  CO2 22 - 29 mEq/L _0 Calcium 8.4 - 10.4 mg/dL 8.4 8.7 8.8  Total Protein 6.4 - 8.3 g/dL 6.4 6.6 6.7  Total Bilirubin 0.20 - 1.20 mg/dL 0.32 0.4 0.34  Alkaline Phos 40 - 150 U/L 41 33(L) 36(L)  AST 5 - 34 U/L _1 ALT 0 - 55 U/L _2 Lab Results  Component Value Date   WBC 6.1 10/22/2015   HGB 12.0 10/22/2015   HCT 36.9 10/22/2015   MCV 88.2 10/22/2015   PLT 183 10/22/2015   NEUTROABS 4.2 10/22/2015    ASSESSMENT & PLAN:  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 I discussed with her that we need to continue with tamoxifen for a total of 10 years.  She is agreeable to stay on it.  Breast cancer surveillance: 1. Mammogram  September 2018  did not show any evidence of breast cancer 2. 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  The patient has a good understanding of the overall plan. she agrees with it. she will call with any problems that may develop before the next visit here.   Harriette Ohara, MD 10/26/17

## 2017-10-26 NOTE — Telephone Encounter (Signed)
Gave patient AVs and calendar of upcoming march 2020 appointments.  °

## 2017-10-26 NOTE — Assessment & Plan Note (Signed)
Left breast invasive ductal carcinoma detected only on the initial biopsy T1 N0 M0 stage IA, ER 100%, PR 60%, HER-2 negative, Ki-67 11%; lumpectomy done on 02/23/2013 revealed low-grade DCIS 3 sentinel nodes negative status post radiation currently on tamoxifen started November 2014  Tamoxifen toxicities: Patient is tolerating extremely well  Arthralgias: It could be related to her physical activity than related to tamoxifen. I discussed with her that we need to continue with tamoxifen for a total of 10 years.  She is agreeable to stay on it.  Breast cancer surveillance: 1. Mammogram  September 2018  did not show any evidence of breast cancer 2. 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

## 2018-10-24 NOTE — Assessment & Plan Note (Deleted)
Left breast invasive ductal carcinoma detected only on the initial biopsy T1 N0 M0 stage IA, ER 100%, PR 60%, HER-2 negative, Ki-67 11%; lumpectomy done on 02/23/2013 revealed low-grade DCIS 3 sentinel nodes negative status post radiation currently on tamoxifen started November 2014  Tamoxifen toxicities: Patient is tolerating extremely well  Arthralgias: I discussed with her that we need to continue with tamoxifen for a total of 10 years.  She is agreeable to stay on it.  Breast cancer surveillance: 1. Mammogram September 2019 did not show any evidence of breast cancer 2. 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

## 2018-10-25 ENCOUNTER — Inpatient Hospital Stay: Payer: Medicaid Other | Attending: Hematology and Oncology | Admitting: Hematology and Oncology

## 2018-10-25 NOTE — Progress Notes (Deleted)
HEMATOLOGY-ONCOLOGY TELEPHONE VISIT PROGRESS NOTE  I connected with Michelle Petty on 10/25/18 at  2:45 PM EDT by telephone and verified that I am speaking with the correct person using two identifiers.  I discussed the limitations, risks, security and privacy concerns of performing an evaluation and management service by telephone and the availability of in person appointments.  I also discussed with the patient that there may be a patient responsible charge related to this service. The patient expressed understanding and agreed to proceed.   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  She is agreeable to stay on it.  Breast cancer surveillance: 1. Mammogram September 2019 did not show any evidence of breast cancer 2. 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 The patient was provided an opportunity to ask questions and all were answered. The patient agreed with the plan and demonstrated an understanding of the instructions. The patient was advised to call back or seek an in-person evaluation if the symptoms worsen or if the condition fails to improve as anticipated.   I provided *** minutes of non-face-to-face time during this encounter. Harriette Ohara, MD

## 2019-11-30 LAB — HM PAP SMEAR

## 2019-12-05 ENCOUNTER — Other Ambulatory Visit: Payer: Self-pay | Admitting: Hematology and Oncology

## 2019-12-05 DIAGNOSIS — Z853 Personal history of malignant neoplasm of breast: Secondary | ICD-10-CM

## 2019-12-05 DIAGNOSIS — Z9889 Other specified postprocedural states: Secondary | ICD-10-CM

## 2019-12-16 ENCOUNTER — Other Ambulatory Visit: Payer: Self-pay

## 2019-12-16 ENCOUNTER — Ambulatory Visit
Admission: RE | Admit: 2019-12-16 | Discharge: 2019-12-16 | Disposition: A | Discharge status: Home / Self Care | Payer: Medicaid Other | Source: Ambulatory Visit | Attending: Hematology and Oncology | Admitting: Hematology and Oncology

## 2019-12-16 DIAGNOSIS — Z853 Personal history of malignant neoplasm of breast: Secondary | ICD-10-CM

## 2019-12-16 DIAGNOSIS — Z9889 Other specified postprocedural states: Secondary | ICD-10-CM

## 2022-03-08 IMAGING — MG DIGITAL DIAGNOSTIC BILAT W/ TOMO W/ CAD
6 of 11 series · 6 of 31 positions shown · non-contrast
Comparison: Previous exam(s).

CLINICAL DATA: 54-year-old female for delayed annual follow-up,
with history of LEFT breast cancer and LEFT lumpectomy in 9493.

EXAM:
DIGITAL DIAGNOSTIC BILATERAL MAMMOGRAM WITH CAD AND TOMO

[L CC]
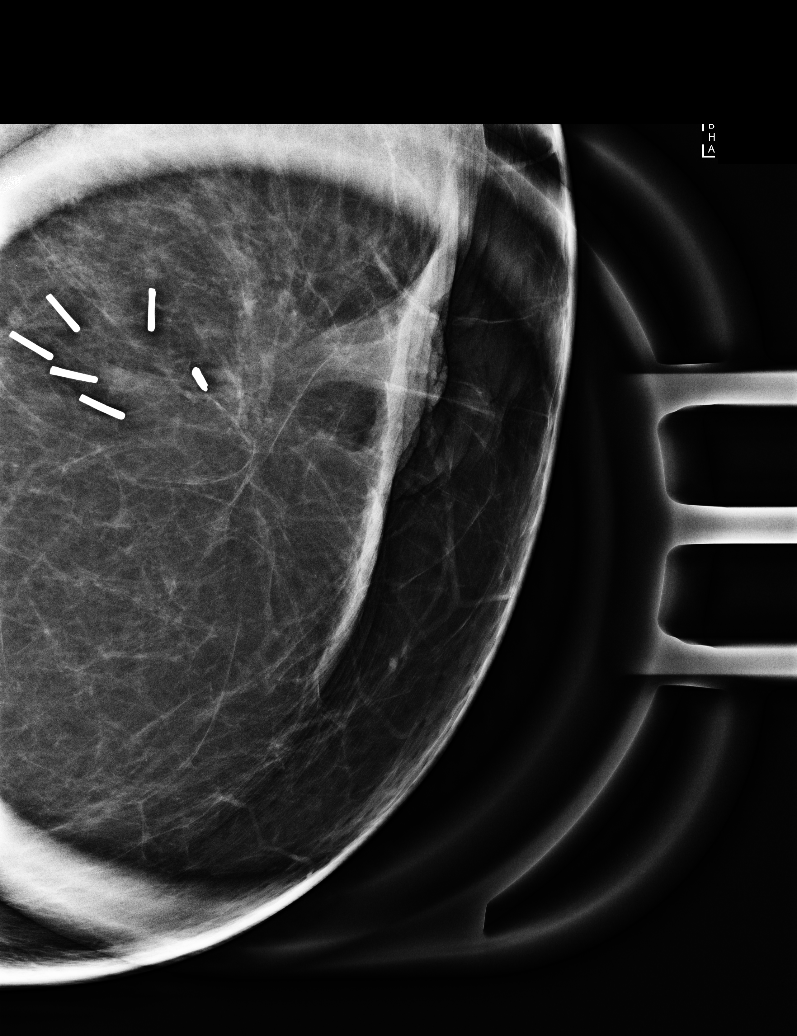

[L CC synth-2D]
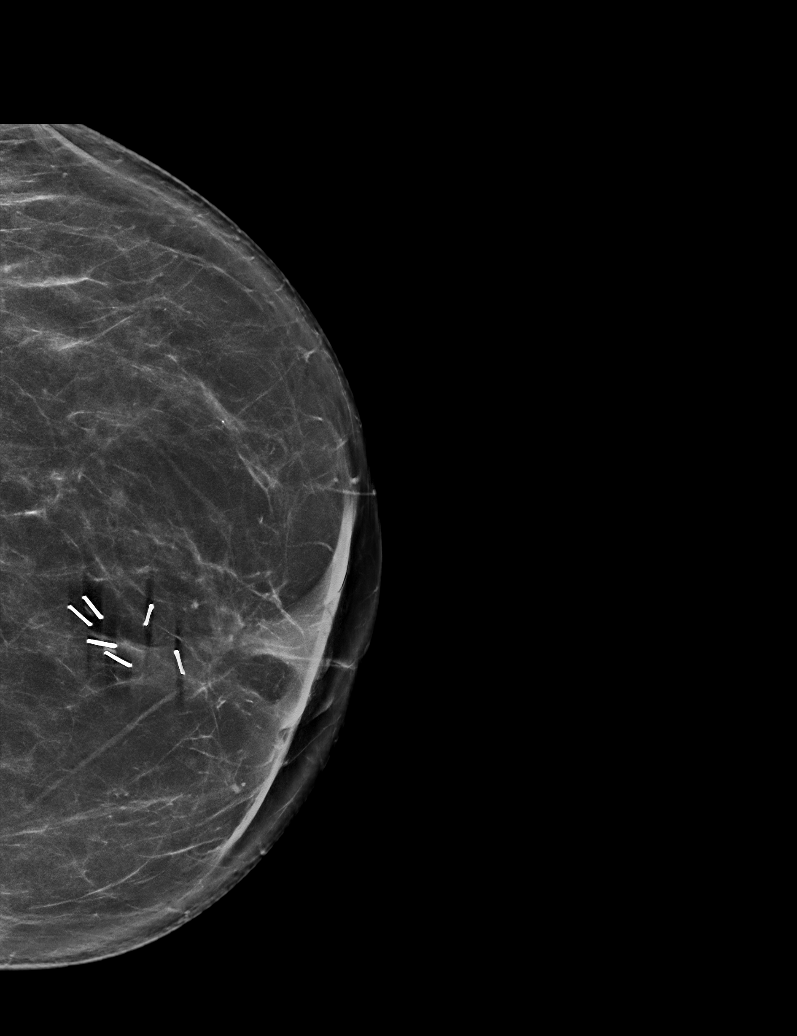

[R MLO synth-2D (1 of 2)]
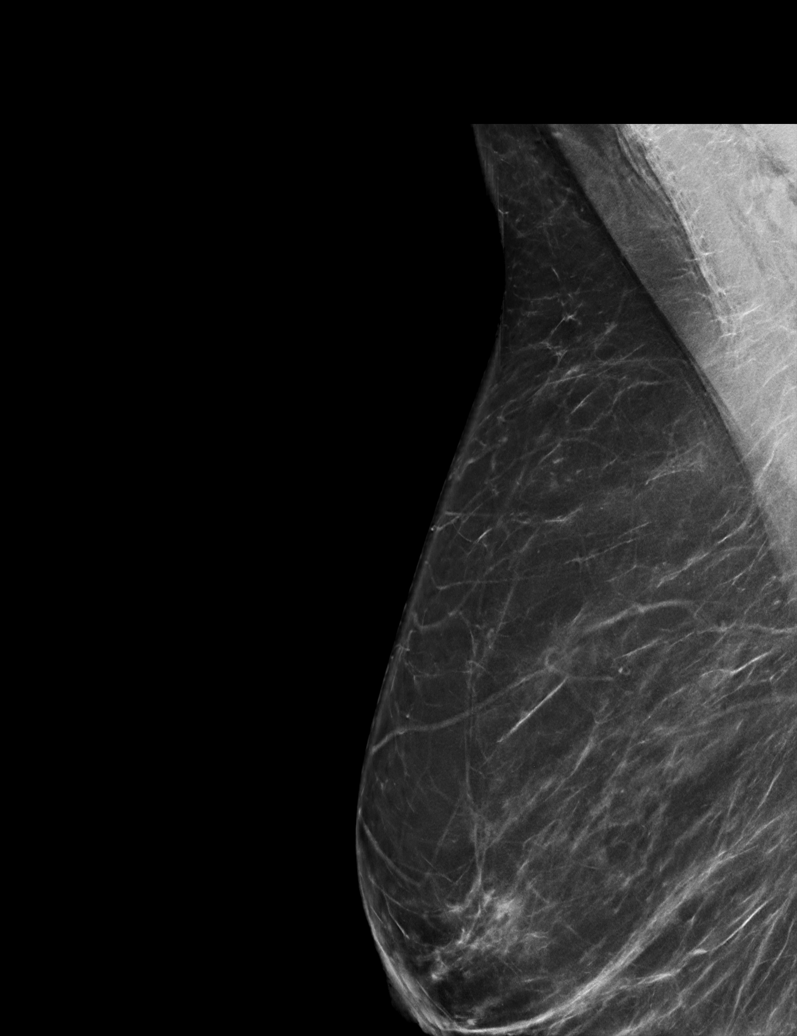

[R MLO synth-2D (2 of 2)]
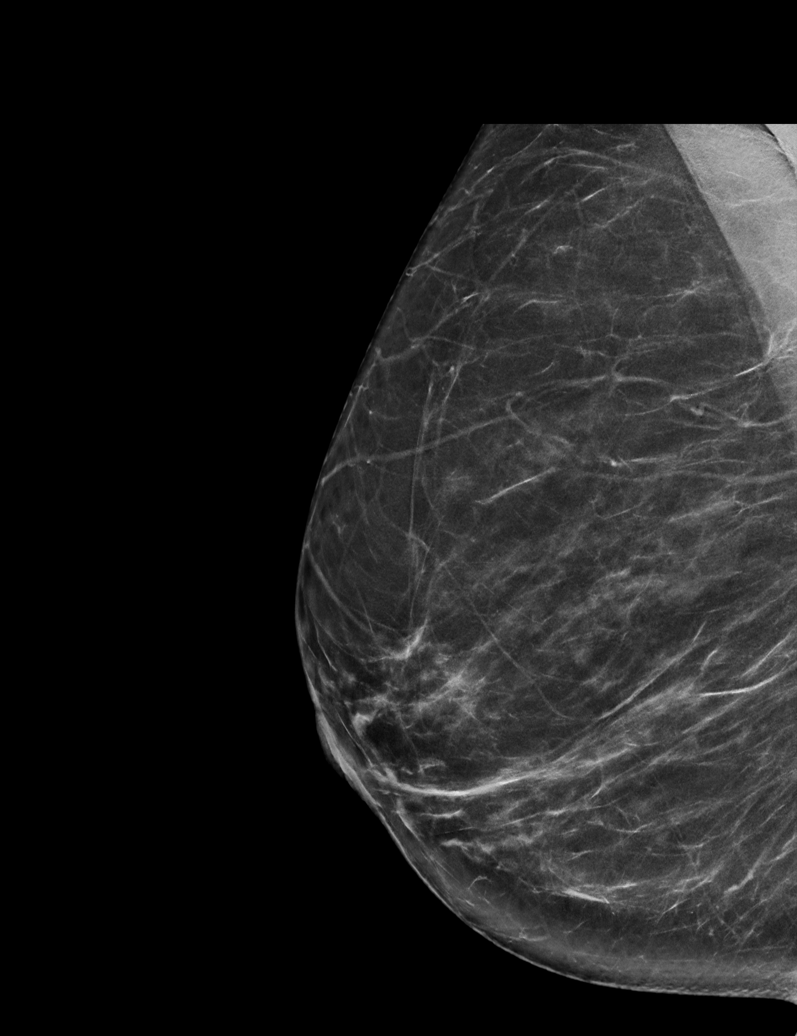

[L MLO synth-2D]
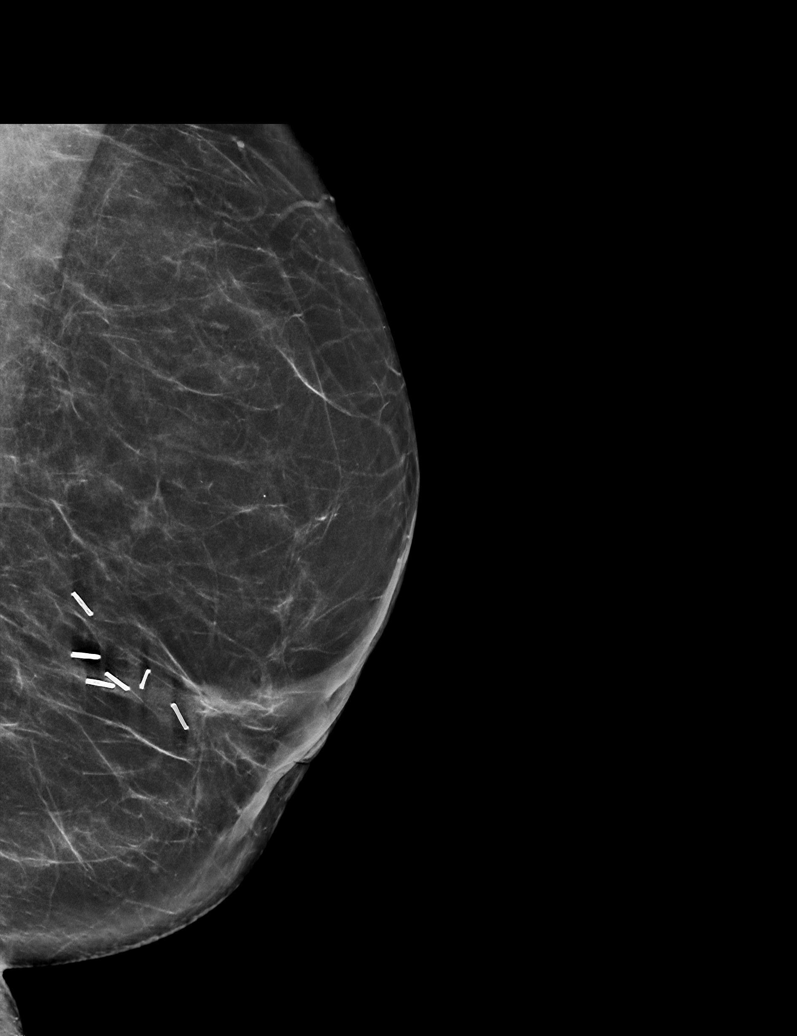

[R CC synth-2D]
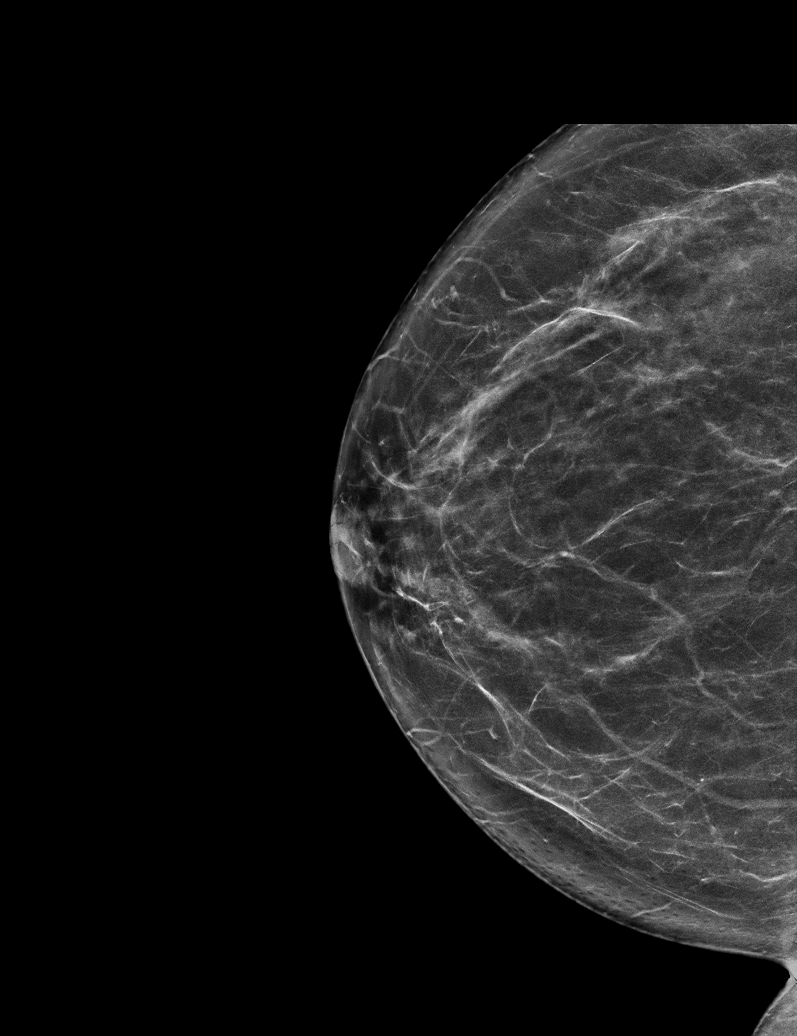

[6 of 31 positions shown; findings below may reference images not displayed]

ACR Breast Density Category b: There are scattered areas of
fibroglandular density.
FINDINGS: 2D and 3D full field views of both breasts and a magnification view
of the lumpectomy site demonstrate no suspicious mass, nonsurgical
distortion or worrisome calcifications.

LEFT lumpectomy changes are again noted.

Mammographic images were processed with CAD.
IMPRESSION: No evidence of breast malignancy.

RECOMMENDATION:
Bilateral screening mammogram in 1 year.

I have discussed the findings and recommendations with the patient.
If applicable, a reminder letter will be sent to the patient
regarding the next appointment.

BI-RADS CATEGORY  2: Benign.

## 2022-06-27 DIAGNOSIS — Z419 Encounter for procedure for purposes other than remedying health state, unspecified: Secondary | ICD-10-CM | POA: Diagnosis not present

## 2022-07-28 DIAGNOSIS — Z419 Encounter for procedure for purposes other than remedying health state, unspecified: Secondary | ICD-10-CM | POA: Diagnosis not present

## 2022-08-28 DIAGNOSIS — Z419 Encounter for procedure for purposes other than remedying health state, unspecified: Secondary | ICD-10-CM | POA: Diagnosis not present

## 2022-08-29 ENCOUNTER — Telehealth: Payer: Self-pay

## 2022-08-29 NOTE — Telephone Encounter (Signed)
Mychart msg sent. AS, CMA 

## 2022-09-26 DIAGNOSIS — Z419 Encounter for procedure for purposes other than remedying health state, unspecified: Secondary | ICD-10-CM | POA: Diagnosis not present

## 2022-10-27 DIAGNOSIS — Z419 Encounter for procedure for purposes other than remedying health state, unspecified: Secondary | ICD-10-CM | POA: Diagnosis not present

## 2022-11-26 DIAGNOSIS — Z419 Encounter for procedure for purposes other than remedying health state, unspecified: Secondary | ICD-10-CM | POA: Diagnosis not present

## 2022-12-27 DIAGNOSIS — Z419 Encounter for procedure for purposes other than remedying health state, unspecified: Secondary | ICD-10-CM | POA: Diagnosis not present

## 2023-01-26 DIAGNOSIS — Z419 Encounter for procedure for purposes other than remedying health state, unspecified: Secondary | ICD-10-CM | POA: Diagnosis not present

## 2023-02-26 DIAGNOSIS — Z419 Encounter for procedure for purposes other than remedying health state, unspecified: Secondary | ICD-10-CM | POA: Diagnosis not present

## 2023-03-29 DIAGNOSIS — Z419 Encounter for procedure for purposes other than remedying health state, unspecified: Secondary | ICD-10-CM | POA: Diagnosis not present

## 2023-04-28 DIAGNOSIS — Z419 Encounter for procedure for purposes other than remedying health state, unspecified: Secondary | ICD-10-CM | POA: Diagnosis not present

## 2023-05-29 DIAGNOSIS — Z419 Encounter for procedure for purposes other than remedying health state, unspecified: Secondary | ICD-10-CM | POA: Diagnosis not present

## 2023-06-28 DIAGNOSIS — Z419 Encounter for procedure for purposes other than remedying health state, unspecified: Secondary | ICD-10-CM | POA: Diagnosis not present

## 2023-07-29 DIAGNOSIS — Z419 Encounter for procedure for purposes other than remedying health state, unspecified: Secondary | ICD-10-CM | POA: Diagnosis not present

## 2023-08-29 DIAGNOSIS — Z419 Encounter for procedure for purposes other than remedying health state, unspecified: Secondary | ICD-10-CM | POA: Diagnosis not present

## 2023-09-26 DIAGNOSIS — Z419 Encounter for procedure for purposes other than remedying health state, unspecified: Secondary | ICD-10-CM | POA: Diagnosis not present

## 2023-11-07 DIAGNOSIS — Z419 Encounter for procedure for purposes other than remedying health state, unspecified: Secondary | ICD-10-CM | POA: Diagnosis not present

## 2023-12-07 ENCOUNTER — Encounter: Payer: Self-pay | Admitting: Internal Medicine

## 2023-12-07 ENCOUNTER — Ambulatory Visit (INDEPENDENT_AMBULATORY_CARE_PROVIDER_SITE_OTHER): Admitting: Internal Medicine

## 2023-12-07 VITALS — BP 108/68 | HR 77 | Ht 69.0 in | Wt 165.0 lb

## 2023-12-07 DIAGNOSIS — M25511 Pain in right shoulder: Secondary | ICD-10-CM

## 2023-12-07 DIAGNOSIS — G8929 Other chronic pain: Secondary | ICD-10-CM

## 2023-12-07 DIAGNOSIS — M25512 Pain in left shoulder: Secondary | ICD-10-CM | POA: Diagnosis not present

## 2023-12-07 DIAGNOSIS — M25519 Pain in unspecified shoulder: Secondary | ICD-10-CM | POA: Insufficient documentation

## 2023-12-07 DIAGNOSIS — Z419 Encounter for procedure for purposes other than remedying health state, unspecified: Secondary | ICD-10-CM | POA: Diagnosis not present

## 2023-12-07 DIAGNOSIS — C50312 Malignant neoplasm of lower-inner quadrant of left female breast: Secondary | ICD-10-CM | POA: Diagnosis not present

## 2023-12-07 DIAGNOSIS — M7711 Lateral epicondylitis, right elbow: Secondary | ICD-10-CM

## 2023-12-07 DIAGNOSIS — M25569 Pain in unspecified knee: Secondary | ICD-10-CM

## 2023-12-07 MED ORDER — CELECOXIB 100 MG PO CAPS: 100.0000 mg | ORAL_CAPSULE | Freq: Two times a day (BID) | ORAL | 3 refills | Status: AC

## 2023-12-07 NOTE — Patient Instructions (Addendum)
 VISIT SUMMARY:  Today, we discussed your ongoing joint pain and breast cancer surveillance. You have been experiencing knee pain since 2015, shoulder pain for the past six months, and right elbow pain. We also reviewed your history of breast cancer and the importance of regular screenings.  YOUR PLAN:  -BREAST CANCER: You have completed a 5-year course of tamoxifen  and are undergoing yearly surveillance. Regular 3D mammograms and MRIs are recommended for better monitoring. Early detection is crucial to prevent the spread of cancer. We will order a 3D mammogram and MRI of your left breast, with an ultrasound if any abnormalities are found.  -KNEE PAIN: Your chronic knee pain is likely due to arthritis, which is a condition that causes joint inflammation and pain. We discussed the potential need for a knee replacement in the future if symptoms worsen. For now, no extensive workup is needed unless your symptoms significantly worsen.  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 We recommend making ergonomic adjustments to your work activities and using resistance bands for exercise to avoid sudden, jerky motions.  -RIGHT ELBOW PAIN (LATERAL EPICONDYLITIS): Your right elbow pain is likely due to lateral epicondylitis, a condition caused by repetitive motion that leads to tendon inflammation. We recommend using a tennis elbow strap during work to reduce strain on the tendon and advise against using compression sleeves. Ergonomic adjustments to your work activities are also suggested, and Celebrex is prescribed for occasional inflammation.  -GENERAL HEALTH MAINTENANCE: We encourage you to utilize Medicaid for comprehensive care and discuss the use of AI tools for health management and ergonomic solutions. An annual physical and blood work are recommended.  INSTRUCTIONS:  Please schedule a follow-up appointment next  month for a wellness visit and to review your mammogram and MRI results.      Buy resistance bands for exercise, Strap right elbow. Stop jerky vacuum motions.   Goals: Reduce joint strain during vacuuming Maintain or improve efficiency Prevent further injury  Recommended Solutions: ? 1. Use a Self-Propelled or Robotic Vacuum Self-propelled upright vacuums (like the [employer] WindTunnel T-Series or [employer]) reduce the amount of pushing force needed. Robotic vacuums (like Roomba or Roborock) can cover most floor areas without manual labor. The patient can then spot-clean only where needed. ? 2. Switch to a [employer] like [employer], Shark [place], or [employer] CordZero are much lighter and easier to maneuver with less shoulder/elbow strain. These often have swiveling heads to minimize force during directional changes. ? 3. Ergonomic Technique and Equipment Use Use both hands: One hand on the handle, the other gently guiding -- balances the load across both arms. Avoid jerky or rapid back-and-forth motions: Teach a more smooth, gliding movement -- even if slightly slower, this is more sustainable and could still be time-efficient with the right vacuum. Adjust handle height to elbow level: This reduces strain by allowing a more neutral arm position. ? 4. Occupational Therapy Referral A therapist can work with her on: Safe vacuuming techniques Customized movement modifications Strengthening/stretching to support healing ? 5. Schedule Modifications or Job Reassignment (if possible) If pain persists, light-duty tasks or adjusted roles may be necessary while healing progresses.  Would you like a few model recommendations or comparison charts for vacuums specifically suited to ergonomic cleaning? You said: she needs a way to maximize speed. looking to combine a self propelled with both hands in combination with a robotic  vaccuum that works well with  humans Chat[employer] said: Suella Emmer -- combining a robotic vacuum to handle general floor cleaning and a self-propelled vacuum for fast, ergonomic manual work is an ideal approach. This setup can preserve the patient's speed and efficiency while protecting her joints.  ? 1. Robotic Vacuum: Works Alongside Human Look for a robot vacuum that: Covers large, open areas autonomously Avoids obstacles reliably (so she doesn't have to rescue it) Can be scheduled to clean daily so she only has to spot-clean ?? Best-in-Class Models: Roborock S8 Pro Ultra or Roborock Q Revo Excellent navigation, self-emptying, and mopping Works well on mixed surfaces iRobot Roomba Combo j7+ Very user-friendly, reliable avoidance Works well in homes with clutter or furniture  ? 2. Self-Propelled Vacuum (Manual Assist, Ergonomic) She'll want something powered, lightweight, and ideally with a swiveling head to minimize shoulder/elbow stress. ?? Top Options: Retail banker HushTone Hard-Bagged Upright [phone]) Self-propelled, quiet, built for commercial speed Durable and Civil [employer] fast streamer Lift-Away Strong suction, self-propelled feel, lightweight Can use both hands comfortably [employer] 3 Not labeled self-propelled, but glides easily with a dual-hand grip Swivel steering reduces jerky arm movements  ? Combined Use Strategy (Human + Robot Efficiency) Morning: Start robot vacuum in large open spaces While it runs, she can spot-clean problem areas (corners, under furniture) with the manual vacuum using both hands in slow, gliding motions Technique: Keep arms close to body, engage core, avoid reaching Use a two-hand grip and let the vacuum's motion do the work Stick to short forward passes rather than long aggressive strokes

## 2023-12-07 NOTE — Progress Notes (Unsigned)
 [facility]  Phone: [phone]  - Medical Office Visit -  Visit Date: 12/07/2023 Patient: Michelle Petty   DOB: 1966-03-24   58 y.o. Female  MRN: [id number] Patient Care Team: Anthon Kins, MD as PCP - General (Internal Medicine) Today's Health Care Provider: Anthon Kins, MD  ===========================================    Chief Complaint / Reason for Visit: Establish Care, Shoulder Pain (Patient has been experiencing some pain in her R shoulder/upper back, neck and the back of her knees. Patient says she thinks it has gradually gotten worse with age. Patient would like to discuss with provider at today's visit. ), Neck Pain, and Knee Pain  History of Present Illness  58 year old female with breast cancer who presents with joint pain and surveillance for breast cancer.  She has experienced posterior knee pain since 2015, located above the back of her knees. The pain occurs daily with varying intensity and is accompanied by crepitus when ascending or descending stairs. She has not undergone imaging or consulted specialists for this issue.  She has a history of breast cancer and completed a five-year course of tamoxifen . She is undergoing yearly surveillance and requires a mammogram this year. Initially, she had 3D mammograms, but the last one was a regular mammogram.  She experiences bilateral shoulder pain, more intense on the right side, which has been severe for the past six months. 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 She uses Voltaren cream and magnesium spray for relief, particularly before bed. There is no history of major trauma to the shoulders.  She reports right elbow pain, described as feeling like she 'hit it on something' after a long day, consistent with overuse from her work. She has not used any specific treatments for this condition.  She mentions being frequently tired but does not take medications regularly, having only taken  Tylenol  twice in the past year.    Problem overviews updated today: Problem  Shoulder Pain   Bilateral R>L Worsened in early 2025 Uses Voltaren cream and magnesium spray Exacerbated by work as housekeeper No specific major trauma   Posterior Knee Pain   Also crackles and pops with 2 bilateral started  2015. 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 No imaging or specialist yet.   Irregular Menstrual Cycle (Resolved)  Menorrhagia (Resolved)  Personal History of Malignant Neoplasm of Breast (Resolved)    Medications updated/reviewed: No current outpatient medications on file prior to visit.   No current facility-administered medications on file prior to visit.   Medications Discontinued During This Encounter  Medication Reason   0.9 %  sodium chloride  infusion    ibuprofen (ADVIL,MOTRIN) 600 MG tablet    calcium-vitamin D (OSCAL WITH D) 500-200 MG-UNIT per tablet    tamoxifen  (NOLVADEX ) 20 MG tablet    Current Meds  Medication Sig   celecoxib (CELEBREX) 100 MG capsule Take 1 capsule (100 mg total) by mouth 2 (two) times daily.    Allergies:   Allergies as of 12/07/2023   (No Known Allergies)   Past Medical History:  has a past medical history of Breast cancer (HCC) (01/21/2013), radiation therapy (04/26/13- 05/30/13), Irregular menstrual cycle (11/07/2013), Menorrhagia (11/07/2013), Personal history of malignant neoplasm of breast (01/25/2013), and Personal history of radiation therapy (2014). Past Surgical History:   has a past surgical history that includes Achilles tendon repair (Left, 1985); Cesarean section (2001); Wisdom tooth extraction; Breast lumpectomy with needle  localization and axillary sentinel lymph node bx (Left, 02/23/2013); Excision of breast biopsy (Left, 03/18/2013); Breast lumpectomy (Left, 02/23/2013); and Breast biopsy (Left, 01/21/13). Social History:   reports that she has never smoked. She has never  used smokeless tobacco. She reports that she does not drink alcohol and does not use drugs. Family History:  family history includes COPD in her mother; Diabetes in her maternal grandmother and mother; Stroke in her brother. Depression Screen and Health Maintenance:    03/28/2014   10:37 AM  PHQ 2/9 Scores  PHQ - 2 Score 0   Health Maintenance  Topic Date Due   DTaP/Tdap/Td (1 - Tdap) Never done   Zoster Vaccines- Shingrix (1 of 2) Never done   COVID-19 Vaccine (3 - Pfizer risk series) 11/23/2019   MAMMOGRAM  12/15/2021   Cervical Cancer Screening (HPV/Pap Cotest)  11/30/2022   INFLUENZA VACCINE  02/26/2024   Colonoscopy  06/16/2026   Hepatitis C Screening  Completed   HIV Screening  Completed   HPV VACCINES  Aged Out   Meningococcal B Vaccine  Aged Out   Immunization History  Administered Date(s) Administered   Influenza,inj,Quad PF,6+ Mos 05/04/2014   PFIZER(Purple Top)SARS-COV-2 Vaccination 10/05/2019, 10/26/2019     Objective   Physical ExamBP 108/68   Pulse 77   Ht 5\' 9"  (1.753 m)   Wt 165 lb (74.8 kg)   LMP 02/03/2017 (Approximate)   SpO2 97%   BMI 24.37 kg/m  Wt Readings from Last 10 Encounters:  12/07/23 165 lb (74.8 kg)  10/26/17 149 lb 12.8 oz (67.9 kg)  10/20/16 152 lb (68.9 kg)  06/16/16 153 lb (69.4 kg)  06/02/16 153 lb (69.4 kg)  10/22/15 151 lb 9.6 oz (68.8 kg)  03/29/15 153 lb 3.2 oz (69.5 kg)  02/14/15 152 lb (68.9 kg)  10/18/14 152 lb 12.8 oz (69.3 kg)  05/04/14 156 lb (70.8 kg)  Vital signs reviewed.  Nursing notes reviewed. Weight trend reviewed. General Appearance:  Well developed, well nourished, well-groomed, healthy-appearing female with Body mass index is 24.37 kg/m. No acute distress appreciable.   Skin: Clear and well-hydrated. Pulmonary:  Normal work of breathing at rest, no respiratory distress apparent. SpO2: 97 %  Musculoskeletal: She demonstrates smooth and coordinated movements throughout all major joints.All extremities are intact.   Neurological:  Awake, alert, oriented, and engaged.  No obvious focal neurological deficits or cognitive impairments.  Sensorium seems unclouded.  Psychiatric:  Appropriate mood, pleasant and cooperative demeanor, cheerful and engaged during the exam  Reviewed Results & Data Results LABS Blood work: Protein deficiency (2017)    Results for orders placed or performed in visit on 12/07/23  HM PAP SMEAR  Result Value Ref Range   HM Pap smear in Chart     Office Visit on 12/07/2023  Component Date Value   HM Pap smear 11/30/2019 in Chart    No image results found.   No results found.  MM DIAG BREAST TOMO BILATERAL Result Date: 12/16/2019 CLINICAL DATA:  58 year old female for delayed annual follow-up, with history of LEFT breast cancer and LEFT lumpectomy in 2014. EXAM: DIGITAL DIAGNOSTIC BILATERAL MAMMOGRAM WITH CAD AND TOMO COMPARISON:  Previous exam(s). ACR Breast Density Category b: There are scattered areas of fibroglandular density. FINDINGS: 2D and 3D full field views of both breasts and a magnification view of the lumpectomy site demonstrate no suspicious mass, nonsurgical distortion or worrisome calcifications. LEFT lumpectomy changes are again noted. Mammographic images were processed with CAD. IMPRESSION: No evidence of  breast malignancy. RECOMMENDATION: Bilateral screening mammogram in 1 year. I have discussed the findings and recommendations with the patient. If applicable, a reminder letter will be sent to the patient regarding the next appointment. BI-RADS CATEGORY  2: Benign. Electronically Signed   By: Sundra Engel M.D.   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 Symptoms worsen with prolonged standing and housekeeping activities. Discussed the potential future need for knee replacement, which could cost up to $40,000,  with insurance covering costs after a $10,000 out-of-pocket maximum. Advise against immediate extensive workup unless symptoms worsen significantly. Discuss potential future need for knee replacement if symptoms progress. Social determinants of health limit management options she is on medicaid. 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 Symptoms have worsened over the last six months, possibly due to tendon wear and bone spurs from repetitive motion. Recommend ergonomic adjustments to work activities and suggest using resistance bands for exercise to avoid jerky motions. 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 Symptoms include tenderness and inflammation of the tendon. Recommend using a tennis elbow strap during work to reduce tendon strain and advise against using compression sleeves. Suggest ergonomic adjustments to work activities and prescribe Celebrex for breakthrough inflammation, with caution against regular use. Primary cancer of lower-inner quadrant of left female breast [facility]) She completed a 5-year course of tamoxifen  and is undergoing yearly surveillance. Although her last mammogram was regular, 3D mammograms and MRIs are recommended for enhanced surveillance. She missed some screenings due to lack of insurance and personal responsibilities. Emphasized the importance of early detection to prevent metastasis, which significantly reduces survival rates. Order a 3D mammogram and MRI of the left breast, with reflex to ultrasound if any abnormalities are detected.  General Health Maintenance   Encourage utilizing Medicaid for comprehensive care and discuss the use of AI tools for health management and ergonomic solutions. Schedule an annual physical and blood work. Encourage the use of AI tools for  health management and ergonomic solutions.  Follow-up   Ensure comprehensive care and monitoring of conditions. Schedule a follow-up appointment next month for a wellness visit and review of mammogram and MRI results.   Diagnoses and all orders for this visit: Posterior knee pain, unspecified laterality Chronic pain of both shoulders Lateral epicondylitis of right elbow -     celecoxib (CELEBREX) 100 MG capsule; Take 1 capsule (100 mg total) by mouth 2 (two) times daily. Primary cancer of lower-inner quadrant of left female breast (HCC) -     MM 3D SCREENING MAMMOGRAM BILATERAL BREAST; Future   Recommended follow up: return to clinic 1 month for Comprehensive Physical Exam (CPE) preventive care annual visit   Medical Decision Making: 2 or more stable chronic illnesses Prescription drug management  Diagnosis or treatment significantly limited by social determinants of health       Additional notes: This document was synthesized by artificial intelligence ([employer]bridge) using HIPAA-compliant recording of the clinical interaction;   We discussed the use of AI scribe software for clinical note transcription with the patient, who gave verbal consent to proceed.    Additional Info: This encounter employed state-of-the-art, real-time, collaborative documentation. The patient actively reviewed and assisted in updating their  electronic medical record on a shared screen, ensuring transparency and facilitating joint problem-solving for the problem list, overview, and plan. This approach promotes accurate, informed care. The treatment plan was discussed and reviewed in detail, including medication safety, potential side effects, and all patient questions. We confirmed understanding and comfort with the plan. Follow-up instructions were established, including contacting the office for any concerns, returning if symptoms worsen, persist, or new symptoms develop, and precautions for potential emergency  department visits.  Initial Appointment Goals:  This initial visit focused on establishing a foundation for the patient's care. We collaboratively reviewed her medical history and medications in detail, updating the chart as shown in the encounter. Given the extensive information, we prioritized addressing her most pressing concerns, which she reported were: Establish Care, Shoulder Pain (Patient has been experiencing some pain in her R shoulder/upper back, neck and the back of her knees. Patient says she thinks it has gradually gotten worse with age. Patient would like to discuss with provider at today's visit. ), Neck Pain, and Knee Pain  While the complexity of the patient's medical picture may necessitate further evaluation in subsequent visits, we were able to develop a preliminary care plan together. To expedite a comprehensive plan at the next visit, we encouraged the patient to gather relevant medical records from previous providers. This collaborative approach will ensure a more complete understanding of the patient's health and inform the development of a personalized care plan. We look forward to continuing the conversation and working together with the patient on achieving her health goals.   Collaborative Documentation:  Today's encounter utilized real-time, dynamic patient engagement.  Patients actively participate by directly reviewing and assisting in updating their medical records through a shared screen. This transparency empowers patients to visually confirm chart updates made by the healthcare provider.  This collaborative approach facilitates problem management as we jointly update the problem list, problem overview, and assessment/plan. Ultimately, this process enhances chart accuracy and completeness, fostering shared decision-making, patient education, and informed consent for tests and treatments.  Collaborative Treatment Planning:  Treatment plans were discussed and reviewed in  detail.  Explained medication safety and potential side effects.  Encouraged participation and answered all patient questions, confirming understanding and comfort with the plan. Encouraged patient to contact our office if they have any questions or concerns. Agreed on patient returning to office if symptoms worsen, persist, or new symptoms develop.  ----------------------------------------------------- Anthon Kins, MD  12/07/2023 3:46 PM  [facility]:  [phone]

## 2023-12-08 DIAGNOSIS — M7711 Lateral epicondylitis, right elbow: Secondary | ICD-10-CM | POA: Insufficient documentation

## 2023-12-08 NOTE — Assessment & Plan Note (Signed)
 She completed a 5-year course of tamoxifen  and is undergoing yearly surveillance. Although her last mammogram was regular, 3D mammograms and MRIs are recommended for enhanced surveillance. She missed some screenings due to lack of insurance and personal responsibilities. Emphasized the importance of early detection to prevent metastasis, which significantly reduces survival rates. Order a 3D mammogram and MRI of the left breast, with reflex to ultrasound if any abnormalities are detected.

## 2023-12-08 NOTE — Assessment & Plan Note (Signed)
 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 Symptoms include tenderness and inflammation of the tendon. Recommend using a tennis elbow strap during work to reduce tendon strain and advise against using compression sleeves. Suggest ergonomic adjustments to work activities and prescribe Celebrex for breakthrough inflammation, with caution against regular use.

## 2023-12-08 NOTE — Assessment & Plan Note (Signed)
 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 Symptoms worsen with prolonged standing and housekeeping activities. Discussed the potential future need for knee replacement, which could cost up to $40,000, with insurance covering costs after a $10,000 out-of-pocket maximum. Advise against immediate extensive workup unless symptoms worsen significantly. Discuss potential future need for knee replacement if symptoms progress. Social determinants of health limit management options she is on medicaid.

## 2023-12-08 NOTE — Assessment & Plan Note (Signed)
 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 Symptoms have worsened over the last six months, possibly due to tendon wear and bone spurs from repetitive motion. Recommend ergonomic adjustments to work activities and suggest using resistance bands for exercise to avoid jerky motions.

## 2023-12-12 ENCOUNTER — Other Ambulatory Visit: Payer: Self-pay | Admitting: Medical Genetics

## 2023-12-17 ENCOUNTER — Ambulatory Visit
Admission: RE | Admit: 2023-12-17 | Discharge: 2023-12-17 | Disposition: A | Discharge status: Home / Self Care | Source: Ambulatory Visit | Attending: Internal Medicine | Admitting: Internal Medicine

## 2023-12-17 DIAGNOSIS — C50312 Malignant neoplasm of lower-inner quadrant of left female breast: Secondary | ICD-10-CM

## 2023-12-17 DIAGNOSIS — Z1231 Encounter for screening mammogram for malignant neoplasm of breast: Secondary | ICD-10-CM | POA: Diagnosis not present

## 2023-12-23 ENCOUNTER — Other Ambulatory Visit

## 2023-12-24 ENCOUNTER — Ambulatory Visit: Payer: Self-pay | Admitting: Internal Medicine

## 2023-12-24 ENCOUNTER — Other Ambulatory Visit: Payer: Self-pay | Admitting: Internal Medicine

## 2023-12-24 ENCOUNTER — Telehealth: Payer: Self-pay

## 2023-12-24 ENCOUNTER — Other Ambulatory Visit: Payer: Self-pay | Admitting: Medical Genetics

## 2023-12-24 DIAGNOSIS — Z006 Encounter for examination for normal comparison and control in clinical research program: Secondary | ICD-10-CM

## 2023-12-24 DIAGNOSIS — R928 Other abnormal and inconclusive findings on diagnostic imaging of breast: Secondary | ICD-10-CM

## 2023-12-24 NOTE — Telephone Encounter (Signed)
 Spoke with pt about Diagnostic mammogram she is schedule for next week breast center called her this morning

## 2023-12-24 NOTE — Telephone Encounter (Signed)
 Pt is already on the books to have Mammogram  diagnostic next week after reviewing her chart. Breast center has already reached out to pt .

## 2023-12-24 NOTE — Telephone Encounter (Signed)
-----   Message from Anthon Kins sent at 12/24/2023 12:38 PM EDT ----- Deboraha Fallow investigate this and see if we can streamline this I am willing to give the breast center in open access verbal order to always just order immediate reflex testing whenever they needed so that patients do not have to wait for me to put an order in before they can get their follow-up testing check with the breast center and/or DME about what we can do to help our patients get the best possible care ----- Message ----- From: Marrianne Six, CMA Sent: 12/24/2023  10:08 AM EDT To: Anthon Kins, MD  Good morning I wanted to let you know I have to wait for them to send over order for you to sign can not place a order for it on our end the breast center has to send us  order like before we had to wait.

## 2023-12-29 ENCOUNTER — Ambulatory Visit
Admission: RE | Admit: 2023-12-29 | Discharge: 2023-12-29 | Disposition: A | Discharge status: Home / Self Care | Source: Ambulatory Visit | Attending: Internal Medicine | Admitting: Internal Medicine

## 2023-12-29 ENCOUNTER — Ambulatory Visit
Admission: RE | Admit: 2023-12-29 | Discharge: 2023-12-29 | Disposition: A | Discharge status: Home / Self Care | Source: Ambulatory Visit | Attending: Internal Medicine

## 2023-12-29 DIAGNOSIS — R928 Other abnormal and inconclusive findings on diagnostic imaging of breast: Secondary | ICD-10-CM

## 2024-01-07 DIAGNOSIS — Z419 Encounter for procedure for purposes other than remedying health state, unspecified: Secondary | ICD-10-CM | POA: Diagnosis not present

## 2024-01-12 ENCOUNTER — Ambulatory Visit (INDEPENDENT_AMBULATORY_CARE_PROVIDER_SITE_OTHER): Admitting: Internal Medicine

## 2024-01-12 ENCOUNTER — Encounter: Payer: Self-pay | Admitting: Internal Medicine

## 2024-01-12 VITALS — BP 108/62 | HR 80 | Temp 97.1°F | Ht 69.0 in | Wt 164.8 lb

## 2024-01-12 DIAGNOSIS — Z83511 Family history of glaucoma: Secondary | ICD-10-CM | POA: Diagnosis not present

## 2024-01-12 DIAGNOSIS — Z823 Family history of stroke: Secondary | ICD-10-CM

## 2024-01-12 DIAGNOSIS — Z1239 Encounter for other screening for malignant neoplasm of breast: Secondary | ICD-10-CM

## 2024-01-12 DIAGNOSIS — Z13220 Encounter for screening for lipoid disorders: Secondary | ICD-10-CM

## 2024-01-12 DIAGNOSIS — Z1231 Encounter for screening mammogram for malignant neoplasm of breast: Secondary | ICD-10-CM

## 2024-01-12 DIAGNOSIS — Z0000 Encounter for general adult medical examination without abnormal findings: Secondary | ICD-10-CM | POA: Diagnosis not present

## 2024-01-12 DIAGNOSIS — Z23 Encounter for immunization: Secondary | ICD-10-CM | POA: Diagnosis not present

## 2024-01-12 DIAGNOSIS — Z124 Encounter for screening for malignant neoplasm of cervix: Secondary | ICD-10-CM

## 2024-01-12 LAB — LIPID PANEL
Cholesterol: 219 mg/dL — ABNORMAL HIGH (ref 0–200)
HDL: 55 mg/dL (ref >39.00)
LDL Cholesterol: 146 mg/dL — ABNORMAL HIGH (ref 0–99)
NonHDL: 163.84
Total CHOL/HDL Ratio: 4
Triglycerides: 91 mg/dL (ref 0.0–149.0)
VLDL: 18.2 mg/dL (ref 0.0–40.0)

## 2024-01-12 LAB — CBC WITH DIFFERENTIAL/PLATELET
Basophils Absolute: 0 10*3/uL (ref 0.0–0.1)
Basophils Relative: 0.7 % (ref 0.0–3.0)
Eosinophils Absolute: 0.1 10*3/uL (ref 0.0–0.7)
Eosinophils Relative: 1.4 % (ref 0.0–5.0)
HCT: 39.7 % (ref 36.0–46.0)
Hemoglobin: 13.2 g/dL (ref 12.0–15.0)
Lymphocytes Relative: 23.3 % (ref 12.0–46.0)
Lymphs Abs: 1.5 10*3/uL (ref 0.7–4.0)
MCHC: 33.2 g/dL (ref 30.0–36.0)
MCV: 85 fl (ref 78.0–100.0)
Monocytes Absolute: 0.4 10*3/uL (ref 0.1–1.0)
Monocytes Relative: 6.4 % (ref 3.0–12.0)
Neutro Abs: 4.3 10*3/uL (ref 1.4–7.7)
Neutrophils Relative %: 68.2 % (ref 43.0–77.0)
Platelets: 274 10*3/uL (ref 150.0–400.0)
RBC: 4.67 Mil/uL (ref 3.87–5.11)
RDW: 14.7 % (ref 11.5–15.5)
WBC: 6.3 10*3/uL (ref 4.0–10.5)

## 2024-01-12 LAB — COMPREHENSIVE METABOLIC PANEL WITH GFR
ALT: 9 U/L (ref 0–35)
AST: 13 U/L (ref 0–37)
Albumin: 4.3 g/dL (ref 3.5–5.2)
Alkaline Phosphatase: 76 U/L (ref 39–117)
BUN: 9 mg/dL (ref 6–23)
CO2: 29 meq/L (ref 19–32)
Calcium: 9.4 mg/dL (ref 8.4–10.5)
Chloride: 106 meq/L (ref 96–112)
Creatinine, Ser: 0.82 mg/dL (ref 0.40–1.20)
GFR: 78.83 mL/min (ref >60.00)
Glucose, Bld: 96 mg/dL (ref 70–99)
Potassium: 3.9 meq/L (ref 3.5–5.1)
Sodium: 141 meq/L (ref 135–145)
Total Bilirubin: 0.4 mg/dL (ref 0.2–1.2)
Total Protein: 7.9 g/dL (ref 6.0–8.3)

## 2024-01-12 LAB — TSH: TSH: 2.97 u[IU]/mL (ref 0.35–5.50)

## 2024-01-12 NOTE — Progress Notes (Signed)
 [facility] [address] [place], [place] [address] Office:  [phone]  -- Annual Preventive Medical Office Visit --  Patient:  Michelle Petty      Age: 58 y.o.       Sex:  female  Date:   01/12/2024 Patient Care Team: Anthon Kins, MD as PCP - General (Internal Medicine) Today's Healthcare Provider: Anthon Kins, MD  ========================================= Chief complaint: Annual Exam (Pt is present for cpe pt is fasting for labs.)  Purpose of Visit: Comprehensive preventive health assessment and personalized health maintenance planning.  This encounter was conducted as a Comprehensive Physical Exam (CPE) preventive care annual visit. The patient's medical history and problem list were reviewed to inform individualized preventive care recommendations.  No problem-specific medical treatment enough to warrant a separate additional service charge was provided during this visit.     Assessment & Plan Routine general medical examination at a health care facility A routine annual physical examination is planned with no abnormal findings anticipated. Regular health check-ups and screenings are important, especially as she approaches Medicare age in seven years. She will undergo the physical exam, be referred for a Pap smear, and have lab work ordered post-vaccinations.  Health maintenance discussions included dental care, skin cancer prevention, and sun protection. Regular dental cleanings and daily sunblock use are emphasized, especially for those with fair skin. She is advised to find a new dentist for regular cleanings and to use sunblock daily for skin cancer prevention. Encounter for screening mammogram for malignant neoplasm of breast  FH: glaucoma With a family history of glaucoma, regular eye examinations are essential. A referral for ophthalmology for glaucoma screening has been placed. Screening for lipoid disorders Will order lab testing  to guide management.  Cervical cancer screening After patient agreed, we made referral to gynecology Vaccine for diphtheria-tetanus-pertussis, combined She is due for tetanus and shingles vaccinations and has agreed to receive both. The shingles and TDAP vaccines will be administered.. But we were advised it had to be done at pharmacy. Need for shingles vaccine She is due for tetanus and shingles vaccinations and has agreed to receive both. The shingles and TDAP vaccines will be administered. But we were advised it had to be done at pharmacy. FH: stroke There is a strong family history of stroke. Environmental factors such as diet and trans fats were discussed. She should avoid trans fats and consume healthy fats like extra virgin olive oil and avocado oil to reduce stroke risk. A handout on trans fats and stroke prevention was provided, along with advice on dietary modifications and identifying trans fats in foods. Encounter for screening for malignant neoplasm of breast, unspecified screening modality A recent mammogram showed a scare, and an MRI is scheduled for further evaluation. Regular breast cancer screenings are crucial, particularly with her family history and potential environmental factors. It is noted that 90-95% of breast cancer cases are environmental, with microplastics as a possible factor. The MRI will be completed as scheduled.  Diagnoses and all orders for this visit: Encounter for screening mammogram for malignant neoplasm of breast -     Ambulatory referral to Gynecology FH: glaucoma -     Ambulatory referral to Ophthalmology (for Glaucoma screening) Routine general medical examination at a health care facility -     CBC with Differential -     Comprehensive metabolic panel -     Lipid panel -     TSH -  Visual acuity screening (in office) Screening for lipoid disorders Cervical cancer screening Vaccine for diphtheria-tetanus-pertussis, combined -     Tdap vaccine  greater than or equal to 7yo IM Need for shingles vaccine -     Varicella-Zoster vaccine SQ FH: stroke Encounter for screening for malignant neoplasm of breast, unspecified screening modality   ORDERS:    Orders Placed This Encounter  Procedures   Tdap vaccine greater than or equal to 7yo IM   Varicella-Zoster vaccine SQ   CBC with Differential   Comprehensive metabolic panel   Lipid panel   TSH   Ambulatory referral to Gynecology    Referral Priority:   Routine    Referral Type:   Consultation    Referral Reason:   Specialty Services Required    Requested Specialty:   Gynecology    Number of Visits Requested:   1   Ambulatory referral to Ophthalmology (for Glaucoma screening)    Referral Priority:   Routine    Referral Type:   Consultation    Referral Reason:   Specialty Services Required    Requested Specialty:   Ophthalmology    Number of Visits Requested:   1   Visual acuity screening (in office)   No orders of the defined types were placed in this encounter.       All of the following was reviewed with patient during today's Comprehensive Physical Exam (CPE) preventive care annual visit: HEALTH MAINTENANCE COUNSELING AND ANTICIPATORY GUIDANCE  Reviewed the following verbally with patient and provided AVS materials: Preventive Measure Recommendation  Eye Exams Every 1-2 years  Dental Care Cleanings every 6 months or more, brush/floss 3x daily  Sinus Care Saline spray rinses daily encouraged   Sleep 8 hours nightly, good sleep hygiene, e-monitoring if any daytime drowsiness  Diet Fruits/vegetables/fiber/healthy fats, balance and moderation  Exercise 150 minutes weekly  Risk Behaviors Discouraged any/all high risk behaviors   Bone Health Recommended to maintain a good source of calcium and vitamin D in diet.  She denies any personal history of early osteoporosis or fragility fractures  Anticipatory Counseling Recommend absolute abstinence from all vaped or smoked  recreational or illicit substances of abuse such as tobacco, nicotine, alcohol, illicit drugs, even sugar.   Safe driving / no text and driving.  Health Maintenance Health Maintenance Due  Topic Date Due   DTaP/Tdap/Td (1 - Tdap) Never done   Zoster Vaccines- Shingrix (1 of 2) Never done  She agreed to both today Health Maintenance  Topic Date Due   DTaP/Tdap/Td (1 - Tdap) Never done   Zoster Vaccines- Shingrix (1 of 2) Never done   COVID-19 Vaccine (4 - 2024-25 season) 01/28/2024 (Originally 03/29/2023)   Cervical Cancer Screening (HPV/Pap Cotest)  01/11/2025 (Originally 11/30/2022)   INFLUENZA VACCINE  02/26/2024   MAMMOGRAM  12/16/2025   Colonoscopy  06/16/2026   Hepatitis C Screening  Completed   HIV Screening  Completed   HPV VACCINES  Aged Out   Meningococcal B Vaccine  Aged Out  Reviewed/Encouraged completion.  Immunizations Immunization History  Administered Date(s) Administered   Influenza,inj,Quad PF,6+ Mos 05/04/2014   PFIZER(Purple Top)SARS-COV-2 Vaccination 10/05/2019, 10/26/2019, 06/04/2020  Reviewed.   CANCER SCREENING SHARED DECISION MAKING   Colon HM Colonoscopy          Upcoming     Colonoscopy (Every 10 Years) Next due on 06/16/2026    06/16/2016  COLONOSCOPY   Only the first 1 history entries have been loaded, but more history exists. Confirmed  with patient.              .  Health Maintenance  Topic Date Due   DTaP/Tdap/Td (1 - Tdap) Never done   Zoster Vaccines- Shingrix (1 of 2) Never done   COVID-19 Vaccine (4 - 2024-25 season) 01/28/2024 (Originally 03/29/2023)   Cervical Cancer Screening (HPV/Pap Cotest)  01/11/2025 (Originally 11/30/2022)   INFLUENZA VACCINE  02/26/2024   MAMMOGRAM  12/16/2025   Colonoscopy  06/16/2026   Hepatitis C Screening  Completed   HIV Screening  Completed   HPV VACCINES  Aged Out   Meningococcal B Vaccine  Aged Out   Inquired about any gastrointestinal bleeding/family history   Lung Current guidelines recommend  individuals aged 1 to 56 who currently smoke or formerly smoked and have a >= 20 pack-year smoking history should undergo annual screening with low-dose computed tomography (LDCT). Tobacco Use: Low Risk  (01/12/2024)   Patient History    Smoking Tobacco Use: Never    Smokeless Tobacco Use: Never    Passive Exposure: Not on file    Skin Advised regular sunscreen use. Patient denies worrisome, changing, or new skin lesions. Offered to include images in chart for surveillance. Showed patient these pictures of melanomas for reference to educate for self-monitoring.  Gynecological Result Date Procedure Results Follow-ups  11/30/2019 HM PAP SMEAR HM Pap smear: in Chart    HM Mammogram          Awaiting Completion     MAMMOGRAM (Every 2 Years) Scheduled for 01/15/2024    12/17/2023  MM 3D SCREENING MAMMOGRAM BILATERAL BREAST    Only the first 1 history entries have been loaded, but more history exists.               @IMGMAMMORECDUEDATE @MM  3D DIAGNOSTIC MAMMOGRAM UNILATERAL RIGHT BREAST Result Date: 12/29/2023 CLINICAL DATA:  Patient returns for further evaluation of a RIGHT breast asymmetry in the superior breast, posterior depth seen only on the MLO view. EXAM: DIGITAL DIAGNOSTIC UNILATERAL RIGHT MAMMOGRAM WITH TOMOSYNTHESIS AND CAD TECHNIQUE: Right digital diagnostic mammography and breast tomosynthesis was performed. The images were evaluated with computer-aided detection. COMPARISON:  Previous exam(s). ACR Breast Density Category b: There are scattered areas of fibroglandular density. FINDINGS: Approximately 7 mm asymmetry in the superior and LATERAL RIGHT breast, posterior depth, persists with additional imaging. Due to its far posterior positioning, it is not visualized on the ML view, and is at the margin of the MLO spot tomosynthesis view. Targeted ultrasound of the RIGHT breast demonstrates a normal lymph node measuring 7 x 4 x 7 mm at 10 o'clock 14 cm from the nipple in the axillary  tail, correlating with the mammographic finding. Its cortex measures less than 3 mm. IMPRESSION: RIGHT breast asymmetry described at screening correlates with a benign low axillary/intramammary lymph node. There is no evidence of malignancy. RECOMMENDATION: Patient may return to routine screening mammogram in one year.(Code:[id number]-B-01Y) I have discussed the findings and recommendations with the patient. If applicable, a reminder letter will be sent to the patient regarding the next appointment. BI-RADS CATEGORY  1: Negative. Electronically Signed   By: Shannan Dart M.D.   On: 12/29/2023 11:33   MM 3D SCREENING MAMMOGRAM BILATERAL BREAST Result Date: 12/23/2023 CLINICAL DATA:  Screening. EXAM: DIGITAL SCREENING BILATERAL MAMMOGRAM WITH TOMOSYNTHESIS AND CAD TECHNIQUE: Bilateral screening digital craniocaudal and mediolateral oblique mammograms were obtained. Bilateral screening digital breast tomosynthesis was performed. The images were evaluated with computer-aided detection. COMPARISON:  Previous exam(s). ACR Breast Density Category  b: There are scattered areas of fibroglandular density. FINDINGS: In the right breast, a possible asymmetry warrants further evaluation. In the left breast, no findings suspicious for malignancy. IMPRESSION: Further evaluation is suggested for possible asymmetry in the right breast. RECOMMENDATION: Diagnostic mammogram and possibly ultrasound of the right breast. (Code:[id number]) The patient will be contacted regarding the findings, and additional imaging will be scheduled. BI-RADS CATEGORY  0: Incomplete: Need additional imaging evaluation. Electronically Signed   By: Anna Barnes M.D.   On: 12/23/2023 13:16   MM DIAG BREAST TOMO BILATERAL Result Date: 12/16/2019 CLINICAL DATA:  58 year old female for delayed annual follow-up, with history of LEFT breast cancer and LEFT lumpectomy in January 18, 2013. EXAM: DIGITAL DIAGNOSTIC BILATERAL MAMMOGRAM WITH CAD AND TOMO COMPARISON:  Previous exam(s).  ACR Breast Density Category b: There are scattered areas of fibroglandular density. FINDINGS: 2D and 3D full field views of both breasts and a magnification view of the lumpectomy site demonstrate no suspicious mass, nonsurgical distortion or worrisome calcifications. LEFT lumpectomy changes are again noted. Mammographic images were processed with CAD. IMPRESSION: No evidence of breast malignancy. RECOMMENDATION: Bilateral screening mammogram in 1 year. I have discussed the findings and recommendations with the patient. If applicable, a reminder letter will be sent to the patient regarding the next appointment. BI-RADS CATEGORY  2: Benign. Electronically Signed   By: Sundra Engel M.D.   On: 12/16/2019 10:43   Cervical, breast, uterine, and ovarian cancer screenings require a dedicated well-woman exam with a gynecology specialist which were not included in today's annual primary care preventive visit.  Safe sex is encouraged, but routine urinalysis sexual transmitted infection screenings in asymptomatic patients are not recommended per guidelines.  Patient verbally acknowledged understanding and intent to maintain routine gynecology appointment(s).    Thyroid Palpated thyroid -no nodules of concern(s).  Other Cancers Discussed lack of screening guidelines and insurance coverage for other cancer types.    Discussed the use of AI scribe software for clinical note transcription with the patient, who gave verbal consent to proceed.  History of Present Illness 57 year old female who presents for an annual physical exam.  She is here for her annual physical exam and mentions needing a Pap smear. She has a scheduled MRI following a recent mammogram scare. She is due for tetanus and shingles vaccinations.  There is a significant family history of glaucoma, with her father, grandfather, and uncle affected. She requests a referral for an eye doctor. There is also a strong family history of stroke. Her grandmother  had a massive stroke at 83, her mother had two strokes and passed away in 2022/01/18, and her brother has had twelve strokes. Her mother and brother were heavy smokers, but her grandmother was not.  No current symptoms of depression or smoking habits. She reports good sleep quality, averaging about seven hours per night, with occasional daytime drowsiness. She does not experience snoring or frequent nighttime awakenings, except to use the bathroom.  She uses extra virgin olive oil regularly and applies sunblock daily.  ROS A comprehensive ROS was negative for any concerning symptoms.   Completed medication reconciliation: Current Outpatient Medications on File Prior to Visit  Medication Sig   celecoxib  (CELEBREX ) 100 MG capsule Take 1 capsule (100 mg total) by mouth 2 (two) times daily.   No current facility-administered medications on file prior to visit.  There are no discontinued medications.The following were reviewed and/or entered/updated into our electronic MEDICAL RECORD NUMBERPast Medical History:  Diagnosis Date  Breast cancer (HCC) 01/21/2013   left   Hx of radiation therapy 04/26/13- 05/30/13   left breast 5000 cGy 25 sessions   Irregular menstrual cycle 11/07/2013   Menorrhagia 11/07/2013   Personal history of malignant neoplasm of breast 01/25/2013   Personal history of radiation therapy 2014   Past Surgical History:  Procedure Laterality Date   ACHILLES TENDON REPAIR Left 1985   BREAST BIOPSY Left 01/21/13   malignant    BREAST LUMPECTOMY Left 02/23/2013   BREAST LUMPECTOMY WITH NEEDLE LOCALIZATION AND AXILLARY SENTINEL LYMPH NODE BX Left 02/23/2013   Procedure: BREAST LUMPECTOMY WITH NEEDLE LOCALIZATION AND AXILLARY SENTINEL LYMPH NODE BX;  Surgeon: Rogena Class, MD;  Location: [facility];  Service: General;  Laterality: Left;   CESAREAN SECTION  2001   EXCISION OF BREAST BIOPSY Left 03/18/2013   Procedure: Re-Excision of Breast Cancer, Lateral Margin;   Surgeon: Rogena Class, MD;  Location: [facility];  Service: General;  Laterality: Left;   WISDOM TOOTH EXTRACTION     Social History   Socioeconomic History   Marital status: Single    Spouse name: Not on file   Number of children: 2   Years of education: Not on file   Highest education level: Not on file  Occupational History    Employer: [employer]  Tobacco Use   Smoking status: Never   Smokeless tobacco: Never  Substance and Sexual Activity   Alcohol use: No   Drug use: No   Sexual activity: Not Currently    Partners: Male    Birth control/protection: None    Comment: about every 3 months or so.  Other Topics Concern   Not on file  Social History Narrative   Not on file   Social Drivers of Health   Financial Resource Strain: Not on file  Food Insecurity: Not on file  Transportation Needs: Not on file  Physical Activity: Not on file  Stress: Not on file  Social Connections: Unknown (12/09/2021)   Received from [facility]   Social Network    Social Network: Not on file  Intimate Partner Violence: Unknown (10/31/2021)   Received from [facility]   HITS    Physically Hurt: Not on file    Insult or Talk Down To: Not on file    Threaten Physical Harm: Not on file    Scream or Curse: Not on file    Family History  Problem Relation Age of Onset   Diabetes Mother    COPD Mother    Stroke Brother    Diabetes Maternal Grandmother    Colon cancer Neg Hx    Rectal cancer Neg Hx   No Known Allergies Social History   Substance and Sexual Activity  Sexual Activity Not Currently   Partners: Male   Birth control/protection: None   Comment: about every 3 months or so.   Social History   Tobacco Use   Smoking status: Never   Smokeless tobacco: Never  Substance Use Topics   Alcohol use: No   Drug use: No      01/12/2024    9:57 AM  Depression screen PHQ 2/9  Decreased Interest 0  Down, Depressed, Hopeless 0  PHQ - 2 Score 0  Altered  sleeping 0  Tired, decreased energy 0  Change in appetite 0  Feeling bad or failure about yourself  0  Trouble concentrating 0  Moving slowly or fidgety/restless 0  Suicidal thoughts 0  PHQ-9  Score 0  Difficult doing work/chores Not difficult at all      03/28/2014   10:37 AM  Fall Risk   Falls in the past year? No      Data saved with a previous flowsheet row definition     BP 108/62   Pulse 80   Temp (!) 97.1 F (36.2 C) (Temporal)   Ht 5' 9 (1.753 m)   Wt 164 lb 12.8 oz (74.8 kg)   LMP 02/03/2017 (Approximate)   SpO2 98%   BMI 24.34 kg/m  BP Readings from Last 3 Encounters:  01/12/24 108/62  12/07/23 108/68  10/26/17 118/73   Wt Readings from Last 10 Encounters:  01/12/24 164 lb 12.8 oz (74.8 kg)  12/07/23 165 lb (74.8 kg)  10/26/17 149 lb 12.8 oz (67.9 kg)  10/20/16 152 lb (68.9 kg)  06/16/16 153 lb (69.4 kg)  06/02/16 153 lb (69.4 kg)  10/22/15 151 lb 9.6 oz (68.8 kg)  03/29/15 153 lb 3.2 oz (69.5 kg)  02/14/15 152 lb (68.9 kg)  10/18/14 152 lb 12.8 oz (69.3 kg)  Physical Exam Verbalized:  Physical Exam HEENT: Normal oropharynx. NECK: Thyroid irregular but no discrete nodules. SKIN: Clear skin, no lesions.  GEN: No acute distress, resting comfortably. HEENT: Tympanic membranes normal appearing bilaterally, oropharynx clear, no thyromegaly noted, no palpable lymphadenopathy or thyroid nodules. CARDIOVASCULAR: S1 and S2 heart sounds with regular rate and rhythm, no murmurs appreciated. PULMONARY: Normal work of breathing, clear to auscultation bilaterally, no crackles, wheezes, or rhonchi. ABDOMEN: Soft, nontender, nondistended. MSK: No edema, cyanosis, or clubbing noted. SKIN: Warm, dry, no lesions of concern observed. NEUROLOGICAL: Cranial nerves II-XII grossly intact, strength 5/5 in upper and lower extremities, reflexes symmetric and intact bilaterally. PSYCH: Normal affect and thought content, pleasant and  cooperative.      ======================================  Notes:  This document was synthesized by artificial intelligence ([employer]bridge) using HIPAA-compliant recording of the clinical interaction;   We discussed the use of AI scribe software for clinical note transcription with the patient, who gave verbal consent to proceed.    This encounter employed state-of-the-art, real-time, collaborative documentation. The patient was empowered to actively review and assist in updating their electronic medical record on a shared monitor, ensuring transparency and improving accuracy.    Prior to and at the beginning of Comprehensive Physical Exam (CPE) preventive care annual visit appointment types  we clarify to patients Our goal today is to focus on your preventive or annual Comprehensive Physical Exam (CPE) preventive care annual visit, which typically covers routine screenings and overall health maintenance. However, if you share any new or concerning symptoms--such as dizziness, passing out, severe pain, or anything else that may point to a more serious issue--we are both legally and ethically required to evaluate it. We cannot simply overlook or ignore such concerns, even if you later decide you don't want to discuss them, because it could jeopardize your health.  If addressing a new concern takes us  beyond the scope of the preventive visit, we may need to bill separately for that portion of care. We understand financial considerations are important, and we're happy to discuss your options if something new comes up. However, we want to be clear that once you mention a potentially serious issue, we must investigate it; we can't ethically or legally exclude that from our records or our evaluation. Please let us  know all of your questions or worries. Together, we can decide how best to manage them and how to minimize  any unexpected costs, but we want to keep you safe above all else.   This disclosure is  mandated by professional ethics and legal obligations, as healthcare providers must address any substantial health concerns raised during any patient interaction and a comprehensive ROS is required by insurance companies for billing preventive-care visit type.   This disclosure ultimately discourages patients financially from reporting significant health issues.  In addition to this disclaimer, all orders placed during this encounter were agreed upon after shared decision making, with no guarantees of insurance coverage able to be provided.    IMPORTANT HEALTH REMINDERS: Report any new or changing skin lesions promptly Maintain recommended screening schedules Discuss any new family history of cancer at future visits Follow up on any new symptoms that persist more than two weeks    Medical Screening Exam A medical screening exam (MSE) helps to determine whether you need immediate medical treatment relating to any number of symptoms you are having. This type of exam may be done in an emergency department, an urgent care setting, or your health care provider's office. Depending on your symptoms and severity, you may need additional tests or medical therapy. It is important to note that an MSE does not necessarily mean that you will need or receive further medical testing or interventions if your symptoms are not deemed to be medically urgent (emergent). Tell a health care provider about: Any allergies you have. All medicines you are taking, including vitamins, herbs, eye drops, creams, and over-the-counter medicines. Any problems you or family members have had with anesthetic medicines. Any bleeding problems you have. Any surgeries you have had. Any medical conditions you have. Whether you are pregnant or may be pregnant. What happens during the test? During the exam, a health care provider does a short, often focused, physical exam and asks about your medical history to assess: Your current  symptoms. Your overall health. Your need for possible further medical intervention. What can I expect after the test? If you have a regular health care provider, make an appointment for a follow-up visit with him or her. If you do not have a regular health care provider, ask about resources in your community. Your medical screening exam may determine that: You do not need emergency treatment at this time. You need treatment right away. You need to be transferred to another medical center. This may happen if you need an emergent specialist or consultant that is not available at the medical center you are at. You need to have more tests. A medical specialist may be consulted if needed. Get help right away if: Your condition gets worse. You develop new or troubling symptoms before you see your health care provider. These symptoms may represent a serious problem that is an emergency. Do not wait to see if the symptoms will go away. Get medical help right away. Call your local emergency services (911 in the [place] Do not drive yourself to the hospital. Summary A medical screening exam helps to determine whether you need medical treatment right away. This type of exam may be done in an emergency department, an urgent care setting, or your health care provider's office. During the exam, a health care provider does a short physical exam and asks about your current symptoms and overall health. Depending on the exam, more tests or therapies may be ordered. However, an MSE does not necessarily mean that you will have further medical testing if your symptoms are not deemed to be urgent. If you need  further care that is not offered at your current medical center, you may need to be transferred to another facility. This information is not intended to replace advice given to you by your health care provider. Make sure you discuss any questions you have with your health care provider. Document Revised:  03/27/2021 Document Reviewed: 11/22/2020 Elsevier Patient Education  2024 [employer]  Health Maintenance, Female Adopting a healthy lifestyle and getting preventive care are important in promoting health and wellness. Ask your health care provider about: The right schedule for you to have regular tests and exams. Things you can do on your own to prevent diseases and keep yourself healthy. What should I know about diet, weight, and exercise? Eat a healthy diet  Eat a diet that includes plenty of vegetables, fruits, low-fat dairy products, and lean protein. Do not eat a lot of foods that are high in solid fats, added sugars, or sodium. Maintain a healthy weight Body mass index (BMI) is used to identify weight problems. It estimates body fat based on height and weight. Your health care provider can help determine your BMI and help you achieve or maintain a healthy weight. Get regular exercise Get regular exercise. This is one of the most important things you can do for your health. Most adults should: Exercise for at least 150 minutes each week. The exercise should increase your heart rate and make you sweat (moderate-intensity exercise). Do strengthening exercises at least twice a week. This is in addition to the moderate-intensity exercise. Spend less time sitting. Even light physical activity can be beneficial. Watch cholesterol and blood lipids Have your blood tested for lipids and cholesterol at 58 years of age, then have this test every 5 years. Have your cholesterol levels checked more often if: Your lipid or cholesterol levels are high. You are older than 58 years of age. You are at high risk for heart disease. What should I know about cancer screening? Depending on your health history and family history, you may need to have cancer screening at various ages. This may include screening for: Breast cancer. Cervical cancer. Colorectal cancer. Skin cancer. Lung cancer. What  should I know about heart disease, diabetes, and high blood pressure? Blood pressure and heart disease High blood pressure causes heart disease and increases the risk of stroke. This is more likely to develop in people who have high blood pressure readings or are overweight. Have your blood pressure checked: Every 3-5 years if you are 22-34 years of age. Every year if you are 51 years old or older. Diabetes Have regular diabetes screenings. This checks your fasting blood sugar level. Have the screening done: Once every three years after age 44 if you are at a normal weight and have a low risk for diabetes. More often and at a younger age if you are overweight or have a high risk for diabetes. What should I know about preventing infection? Hepatitis B If you have a higher risk for hepatitis B, you should be screened for this virus. Talk with your health care provider to find out if you are at risk for hepatitis B infection. Hepatitis C Testing is recommended for: Everyone born from [birth year] through 1965. Anyone with known risk factors for hepatitis C. Sexually transmitted infections (STIs) Get screened for STIs, including gonorrhea and chlamydia, if: You are sexually active and are younger than 58 years of age. You are older than 58 years of age and your health care provider tells you that you are  at risk for this type of infection. Your sexual activity has changed since you were last screened, and you are at increased risk for chlamydia or gonorrhea. Ask your health care provider if you are at risk. Ask your health care provider about whether you are at high risk for HIV. Your health care provider may recommend a prescription medicine to help prevent HIV infection. If you choose to take medicine to prevent HIV, you should first get tested for HIV. You should then be tested every 3 months for as long as you are taking the medicine. Pregnancy If you are about to stop having your period  (premenopausal) and you may become pregnant, seek counseling before you get pregnant. Take 400 to 800 micrograms (mcg) of folic acid every day if you become pregnant. Ask for birth control (contraception) if you want to prevent pregnancy. Osteoporosis and menopause Osteoporosis is a disease in which the bones lose minerals and strength with aging. This can result in bone fractures. If you are 23 years old or older, or if you are at risk for osteoporosis and fractures, ask your health care provider if you should: Be screened for bone loss. Take a calcium or vitamin D supplement to lower your risk of fractures. Be given hormone replacement therapy (HRT) to treat symptoms of menopause. Follow these instructions at home: Alcohol use Do not drink alcohol if: Your health care provider tells you not to drink. You are pregnant, may be pregnant, or are planning to become pregnant. If you drink alcohol: Limit how much you have to: 0-1 drink a day. Know how much alcohol is in your drink. In the [place] one drink equals one 12 oz bottle of beer (355 mL), one 5 oz glass of wine (148 mL), or one 1 oz glass of hard liquor (44 mL). Lifestyle Do not use any products that contain nicotine or tobacco. These products include cigarettes, chewing tobacco, and vaping devices, such as e-cigarettes. If you need help quitting, ask your health care provider. Do not use street drugs. Do not share needles. Ask your health care provider for help if you need support or information about quitting drugs. General instructions Schedule regular health, dental, and eye exams. Stay current with your vaccines. Tell your health care provider if: You often feel depressed. You have ever been abused or do not feel safe at home. Summary Adopting a healthy lifestyle and getting preventive care are important in promoting health and wellness. Follow your health care provider's instructions about healthy diet, exercising, and getting  tested or screened for diseases. Follow your health care provider's instructions on monitoring your cholesterol and blood pressure. This information is not intended to replace advice given to you by your health care provider. Make sure you discuss any questions you have with your health care provider. Document Revised: 12/03/2020 Document Reviewed: 12/03/2020 Elsevier Patient Education  2024 [employer].

## 2024-01-12 NOTE — Assessment & Plan Note (Signed)
 There is a strong family history of stroke. Environmental factors such as diet and trans fats were discussed. She should avoid trans fats and consume healthy fats like extra virgin olive oil and avocado oil to reduce stroke risk. A handout on trans fats and stroke prevention was provided, along with advice on dietary modifications and identifying trans fats in foods.

## 2024-01-12 NOTE — Assessment & Plan Note (Signed)
 With a family history of glaucoma, regular eye examinations are essential. A referral for ophthalmology for glaucoma screening has been placed.

## 2024-01-12 NOTE — Patient Instructions (Addendum)
 ?? Trans Fats: What You Need to Know (and How to Avoid Them) Protect Your Heart, Brain, and Overall Health  ? What Are Trans Fats? Trans fats are a type of unhealthy fat that can increase your risk of: Heart disease Stroke Type 2 diabetes Inflammation Memory problems They are artificially made through a process called hydrogenation and were once common in processed foods for better shelf life and texture.  ?? Why Should I Avoid Trans Fats? Even small amounts of trans fats can: Raise "bad" LDL cholesterol Lower "good" HDL cholesterol Cause inflammation in your blood vessels Increase your risk of heart attack or stroke There is no safe level of artificial trans fat.  ?? How to Spot Trans Fats (Even When the Label Says "0g") Food companies can legally say "0 grams trans fat" if the product contains less than 0.5 grams per serving -- but that can add up fast! Look at the ingredients list for these clues: ?? Partially hydrogenated oil ? this means trans fat is present. ? Avoid foods with "shortening" or "hydrogenated" oils.  ?? Common Foods That May Contain Trans Fats Even today, you may find trans fats in: Baked goods (cookies, cakes, pies) Microwave popcorn Crackers Margarine and shortening Fried fast foods Frozen pizza  ? Healthier Choices Choose products with 0g trans fat and no "partially hydrogenated oil" in the ingredients. Use olive oil, avocado oil, or canola oil for cooking. Eat more whole, unprocessed foods: fruits, vegetables, whole grains, and lean proteins. Choose baked over fried, and fresh over packaged.  ?? Takeaway Message Trans fats are harmful, even in small amounts. To protect your health: Read labels carefully. Look beyond "0g trans fat" and scan for "partially hydrogenated oils." Choose whole foods and heart-healthy fats.    Health Maintenance, Female Adopting a healthy lifestyle and getting preventive care are important in promoting health and  wellness. Ask your health care provider about: The right schedule for you to have regular tests and exams. Things you can do on your own to prevent diseases and keep yourself healthy. What should I know about diet, weight, and exercise? Eat a healthy diet  Eat a diet that includes plenty of vegetables, fruits, low-fat dairy products, and lean protein. Do not eat a lot of foods that are high in solid fats, added sugars, or sodium. Maintain a healthy weight Body mass index (BMI) is used to identify weight problems. It estimates body fat based on height and weight. Your health care provider can help determine your BMI and help you achieve or maintain a healthy weight. Get regular exercise Get regular exercise. This is one of the most important things you can do for your health. Most adults should: Exercise for at least 150 minutes each week. The exercise should increase your heart rate and make you sweat (moderate-intensity exercise). Do strengthening exercises at least twice a week. This is in addition to the moderate-intensity exercise. Spend less time sitting. Even light physical activity can be beneficial. Watch cholesterol and blood lipids Have your blood tested for lipids and cholesterol at 58 years of age, then have this test every 5 years. Have your cholesterol levels checked more often if: Your lipid or cholesterol levels are high. You are older than 58 years of age. You are at high risk for heart disease. What should I know about cancer screening? Depending on your health history and family history, you may need to have cancer screening at various ages. This may include screening for: Breast cancer. Cervical  cancer. Colorectal cancer. Skin cancer. Lung cancer. What should I know about heart disease, diabetes, and high blood pressure? Blood pressure and heart disease High blood pressure causes heart disease and increases the risk of stroke. This is more likely to develop in people  who have high blood pressure readings or are overweight. Have your blood pressure checked: Every 3-5 years if you are 3-45 years of age. Every year if you are 30 years old or older. Diabetes Have regular diabetes screenings. This checks your fasting blood sugar level. Have the screening done: Once every three years after age 58 if you are at a normal weight and have a low risk for diabetes. More often and at a younger age if you are overweight or have a high risk for diabetes. What should I know about preventing infection? Hepatitis B If you have a higher risk for hepatitis B, you should be screened for this virus. Talk with your health care provider to find out if you are at risk for hepatitis B infection. Hepatitis C Testing is recommended for: Everyone born from [birth year] through 1965. Anyone with known risk factors for hepatitis C. Sexually transmitted infections (STIs) Get screened for STIs, including gonorrhea and chlamydia, if: You are sexually active and are younger than 58 years of age. You are older than 58 years of age and your health care provider tells you that you are at risk for this type of infection. Your sexual activity has changed since you were last screened, and you are at increased risk for chlamydia or gonorrhea. Ask your health care provider if you are at risk. Ask your health care provider about whether you are at high risk for HIV. Your health care provider may recommend a prescription medicine to help prevent HIV infection. If you choose to take medicine to prevent HIV, you should first get tested for HIV. You should then be tested every 3 months for as long as you are taking the medicine. Pregnancy If you are about to stop having your period (premenopausal) and you may become pregnant, seek counseling before you get pregnant. Take 400 to 800 micrograms (mcg) of folic acid every day if you become pregnant. Ask for birth control (contraception) if you want to prevent  pregnancy. Osteoporosis and menopause Osteoporosis is a disease in which the bones lose minerals and strength with aging. This can result in bone fractures. If you are 53 years old or older, or if you are at risk for osteoporosis and fractures, ask your health care provider if you should: Be screened for bone loss. Take a calcium or vitamin D supplement to lower your risk of fractures. Be given hormone replacement therapy (HRT) to treat symptoms of menopause. Follow these instructions at home: Alcohol use Do not drink alcohol if: Your health care provider tells you not to drink. You are pregnant, may be pregnant, or are planning to become pregnant. If you drink alcohol: Limit how much you have to: 0-1 drink a day. Know how much alcohol is in your drink. In the [place] one drink equals one 12 oz bottle of beer (355 mL), one 5 oz glass of wine (148 mL), or one 1 oz glass of hard liquor (44 mL). Lifestyle Do not use any products that contain nicotine or tobacco. These products include cigarettes, chewing tobacco, and vaping devices, such as e-cigarettes. If you need help quitting, ask your health care provider. Do not use street drugs. Do not share needles. Ask your health care provider for  help if you need support or information about quitting drugs. General instructions Schedule regular health, dental, and eye exams. Stay current with your vaccines. Tell your health care provider if: You often feel depressed. You have ever been abused or do not feel safe at home. Summary Adopting a healthy lifestyle and getting preventive care are important in promoting health and wellness. Follow your health care provider's instructions about healthy diet, exercising, and getting tested or screened for diseases. Follow your health care provider's instructions on monitoring your cholesterol and blood pressure. This information is not intended to replace advice given to you by your health care provider.  Make sure you discuss any questions you have with your health care provider. Document Revised: 12/03/2020 Document Reviewed: 12/03/2020 Elsevier Patient Education  2024 [employer].    Why follow it? Research shows. Those who follow the Mediterranean diet have a reduced risk of heart disease  The diet is associated with a reduced incidence of Parkinson's and Alzheimer's diseases People following the diet may have longer life expectancies and lower rates of chronic diseases  The Dietary Guidelines for Americans recommends the Mediterranean diet as an eating plan to promote health and prevent disease  What Is the Mediterranean Diet?  Healthy eating plan based on typical foods and recipes of Mediterranean-style cooking The diet is primarily a plant based diet; these foods should make up a majority of meals   Starches - Plant based foods should make up a majority of meals - They are an important sources of vitamins, minerals, energy, antioxidants, and fiber - Choose whole grains, foods high in fiber and minimally processed items  - Typical grain sources include wheat, oats, barley, corn, brown rice, bulgar, farro, millet, polenta, couscous  - Various types of beans include chickpeas, lentils, fava beans, black beans, white beans   Fruits  Veggies - Large quantities of antioxidant rich fruits & veggies; 6 or more servings  - Vegetables can be eaten raw or lightly drizzled with oil and cooked  - Vegetables common to the traditional Mediterranean Diet include: artichokes, arugula, beets, broccoli, brussel sprouts, cabbage, carrots, celery, collard greens, cucumbers, eggplant, kale, leeks, lemons, lettuce, mushrooms, okra, onions, peas, peppers, potatoes, pumpkin, radishes, rutabaga, shallots, spinach, sweet potatoes, turnips, zucchini - Fruits common to the Mediterranean Diet include: apples, apricots, avocados, cherries, clementines, dates, figs, grapefruits, grapes, melons, nectarines, oranges,  peaches, pears, pomegranates, strawberries, tangerines  Fats - Replace butter and margarine with healthy oils, such as olive oil, canola oil, and tahini  - Limit nuts to no more than a handful a day  - Nuts include walnuts, almonds, pecans, pistachios, pine nuts  - Limit or avoid candied, honey roasted or heavily salted nuts - Olives are central to the [employer] - can be eaten whole or used in a variety of dishes   Meats Protein - Limiting red meat: no more than a few times a month - When eating red meat: choose lean cuts and keep the portion to the size of deck of cards - Eggs: approx. 0 to 4 times a week  - Fish and lean poultry: at least 2 a week  - Healthy protein sources include, chicken, [place], lean beef, lamb - Increase intake of seafood such as tuna, salmon, trout, mackerel, shrimp, scallops - Avoid or limit high fat processed meats such as sausage and bacon  Dairy - Include moderate amounts of low fat dairy products  - Focus on healthy dairy such as fat free yogurt, skim milk,  low or reduced fat cheese - Limit dairy products higher in fat such as whole or 2% milk, cheese, ice cream  Alcohol - Moderate amounts of red wine is ok  - No more than 5 oz daily for women (all ages) and men older than age 67  - No more than 10 oz of wine daily for men younger than 71  Other - Limit sweets and other desserts  - Use herbs and spices instead of salt to flavor foods  - Herbs and spices common to the traditional Mediterranean Diet include: basil, bay leaves, chives, cloves, cumin, fennel, garlic, lavender, marjoram, mint, oregano, parsley, pepper, rosemary, sage, savory, sumac, tarragon, thyme   It's not just a diet, it's a lifestyle:  The Mediterranean diet includes lifestyle factors typical of those in the region  Foods, drinks and meals are best eaten with others and savored Daily physical activity is important for overall good health This could be strenuous exercise like running  and aerobics This could also be more leisurely activities such as walking, housework, yard-work, or taking the stairs Moderation is the key; a balanced and healthy diet accommodates most foods and drinks Consider portion sizes and frequency of consumption of certain foods   Meal Ideas & Options:  Breakfast:  Whole wheat toast or whole wheat English muffins with peanut butter & hard boiled egg Steel cut oats topped with apples & cinnamon and skim milk  Fresh fruit: banana, strawberries, melon, berries, peaches  Smoothies: strawberries, bananas, greek yogurt, peanut butter Low fat greek yogurt with blueberries and granola  Egg white omelet with spinach and mushrooms Breakfast couscous: whole wheat couscous, apricots, skim milk, cranberries  Sandwiches:  Hummus and grilled vegetables (peppers, zucchini, squash) on whole wheat bread   Grilled chicken on whole wheat pita with lettuce, tomatoes, cucumbers or tzatziki  [place] salad on whole wheat bread: tuna salad made with greek yogurt, olives, red peppers, capers, green onions Garlic rosemary lamb pita: lamb sauted with garlic, rosemary, salt & pepper; add lettuce, cucumber, greek yogurt to pita - flavor with lemon juice and black pepper  Seafood:  Mediterranean grilled salmon, seasoned with garlic, basil, parsley, lemon juice and black pepper Shrimp, lemon, and spinach whole-grain pasta salad made with low fat greek yogurt  Seared scallops with lemon orzo  Seared tuna steaks seasoned salt, pepper, coriander topped with tomato mixture of olives, tomatoes, olive oil, minced garlic, parsley, green onions and cappers  Meats:  Herbed greek chicken salad with kalamata olives, cucumber, feta  Red bell peppers stuffed with spinach, bulgur, lean ground beef (or lentils) & topped with feta   Kebabs: skewers of chicken, tomatoes, onions, zucchini, squash  [place] burgers: made with red onions, mint, dill, lemon juice, feta cheese topped with roasted red  peppers Vegetarian Cucumber salad: cucumbers, artichoke hearts, celery, red onion, feta cheese, tossed in olive oil & lemon juice  Hummus and whole grain pita points with a greek salad (lettuce, tomato, feta, olives, cucumbers, red onion) Lentil soup with celery, carrots made with vegetable broth, garlic, salt and pepper  Tabouli salad: parsley, bulgur, mint, scallions, cucumbers, tomato, radishes, lemon juice, olive oil, salt and pepper.      Fat and Cholesterol Restricted Eating Plan Eating a diet that limits fat and cholesterol may help lower your risk for heart disease and other conditions. Your body needs fat and cholesterol for basic functions, but eating too much of these things can be harmful to your health. Your health care provider may  order lab tests to check your blood fat (lipid) and cholesterol levels. This helps your health care provider understand your risk for certain conditions and whether you need to make diet changes. Work with your health care provider or dietitian to make an eating plan that is right for you. Your plan includes: Limit your fat intake to ______% or less of your total calories a day. This is ______g of fat per day. Limit your saturated fat intake to ______% or less of your total calories a day. This is ______g of saturated fat per day. Limit the amount of cholesterol in your diet to less than _________mg a day. Eat ___________ g of fiber a day. What are tips for following this plan? General guidelines If you are overweight, work with your health care provider to lose weight safely. Losing just 5-10% of your body weight can improve your overall health and help prevent diseases such as diabetes and heart disease. Avoid: Foods with added sugar. Fried foods. Foods that contain partially hydrogenated oils, including stick margarine, some tub margarines, cookies, crackers, and other baked goods. If you drink alcohol: Limit how much you have to: 0-1 drink a day  for women who are not pregnant. 0-2 drinks a day for men. Know how much alcohol is in a drink. In the [place] one drink equals one 12 oz bottle of beer (355 mL), one 5 oz glass of wine (148 mL), or one 1 oz glass of hard liquor (44 mL). Reading food labels Check food labels for: Trans fats or partially hydrogenated oils. Avoid foods that contain these. High amounts of saturated fat. Choose foods that are low in saturated fat (less than 2 g). The amount of cholesterol in each serving. The amount of fiber in each serving. Choose foods with healthy fats, such as: Monounsaturated and polyunsaturated fats. These include olive and canola oil, flaxseeds, walnuts, almonds, and seeds. Omega-3 fats. These are found in foods such as salmon, mackerel, sardines, tuna, flaxseed oil, and ground flaxseeds. Choose grain products that have whole grains. Look for the word whole as the first word in the ingredient list. Cooking Cook foods using methods other than frying. Baking, boiling, grilling, and broiling are some healthy options. Eat more home-cooked food and less restaurant, buffet, and fast food. Avoid cooking using saturated fats. Animal sources of saturated fats include meats, butter, and cream. Plant sources of saturated fats include palm oil, palm kernel oil, and coconut oil. Meal planning  At meals, imagine dividing your plate into fourths: Fill one-half of your plate with vegetables, green salads, and fruit. Fill one-fourth of your plate with whole grains. Fill one-fourth of your plate with lean protein foods. Eat fish that is high in omega-3 fats at least two times a week. Eat more foods that contain fiber, such as whole grains, beans, apples, pears, berries, broccoli, carrots, peas, and barley. These foods help promote healthy cholesterol levels in the blood. What foods should I eat? Fruits All fresh, canned (in natural juice), or frozen fruits. Vegetables Fresh or frozen vegetables (raw,  steamed, roasted, or grilled). Green salads. Grains Whole grains, such as whole wheat or whole grain breads, crackers, cereals, and pasta. Unsweetened oatmeal, bulgur, barley, quinoa, or brown rice. Corn or whole wheat flour tortillas. Meats and other proteins Ground beef (85% or leaner), grass-fed beef, or beef trimmed of fat. Skinless chicken or [place]. Ground chicken or [place]. Pork trimmed of fat. All fish and seafood. Egg whites. Dried beans, peas, or lentils. Unsalted  nuts or seeds. Unsalted canned beans. Natural nut butters without added sugar and oil. Dairy Low-fat or nonfat dairy products, such as skim or 1% milk, 2% or reduced-fat cheeses, low-fat and fat-free ricotta or cottage cheese, or plain low-fat and nonfat yogurt. Fats and oils Tub margarine without trans fats. Light or reduced-fat mayonnaise and salad dressings. Avocado. Olive, canola, sesame, or safflower oils. The items listed above may not be a complete list of foods and beverages you can eat. Contact a dietitian for more information. What foods should I avoid? Fruits Canned fruit in heavy syrup. Fruit in cream or butter sauce. Fried fruit. Vegetables Vegetables cooked in cheese, cream, or butter sauce. Fried vegetables. Grains White bread. White pasta. White rice. Cornbread. Bagels, pastries, and croissants. Crackers and snack foods that contain trans fat and hydrogenated oils. Meats and other proteins Fatty cuts of meat. Ribs, chicken wings, bacon, sausage, bologna, salami, chitterlings, fatback, hot dogs, bratwurst, and packaged lunch meats. Liver and organ meats. Whole eggs and egg yolks. Chicken and [place] with skin. Fried meat. Dairy Whole or 2% milk, cream, half-and-half, and cream cheese. Whole milk cheeses. Whole-fat or sweetened yogurt. Full-fat cheeses. Nondairy creamers and whipped toppings. Processed cheese, cheese spreads, and cheese curds. Fats and oils Butter, stick margarine, lard, shortening, ghee, or  bacon fat. Coconut, palm kernel, and palm oils. Beverages Alcohol. Sugar-sweetened drinks such as sodas, lemonade, and fruit drinks. Sweets and desserts Corn syrup, sugars, honey, and molasses. Candy. Jam and jelly. Syrup. Sweetened cereals. Cookies, pies, cakes, donuts, muffins, and ice cream. The items listed above may not be a complete list of foods and beverages you should avoid. Contact a dietitian for more information. Summary Your body needs fat and cholesterol for basic functions. However, eating too much of these things can be harmful to your health. Work with your health care provider and dietitian to follow a diet that limits fat and cholesterol. Doing this may help lower your risk for heart disease and other conditions. Choose healthy fats, such as monounsaturated and polyunsaturated fats, and foods high in omega-3 fatty acids. Eat fiber-rich foods, such as whole grains, beans, peas, fruits, and vegetables. Limit or avoid alcohol, fried foods, and foods high in saturated fats, partially hydrogenated oils, and sugar. This information is not intended to replace advice given to you by your health care provider. Make sure you discuss any questions you have with your health care provider. Document Revised: 11/23/2020 Document Reviewed: 11/23/2020 Elsevier Patient Education  2024 Else[employer] Inc. American Heart Association [facility]) Exercise Recommendation  Being physically active is important to prevent heart disease and stroke, the nation's No. 1and No. 5killers. To improve overall cardiovascular health, we suggest at least 150 minutes per week of moderate exercise or 75 minutes per week of vigorous exercise (or a combination of moderate and vigorous activity). Thirty minutes a day, five times a week is an easy goal to remember. You will also experience benefits even if you divide your time into two or three segments of 10 to 15 minutes per day.  For people who would benefit from lowering their  blood pressure or cholesterol, we recommend 40 minutes of aerobic exercise of moderate to vigorous intensity three to four times a week to lower the risk for heart attack and stroke.  Physical activity is anything that makes you move your body and burn calories.  This includes things like climbing stairs or playing sports. Aerobic exercises benefit your heart, and include walking, jogging, swimming or biking. Strength  and stretching exercises are best for overall stamina and flexibility.  The simplest, positive change you can make to effectively improve your heart health is to start walking. It's enjoyable, free, easy, social and great exercise. A walking program is flexible and boasts high success rates because people can stick with it. It's easy for walking to become a regular and satisfying part of life.   For Overall Cardiovascular Health: At least 30 minutes of moderate-intensity aerobic activity at least 5 days per week for a total of 150  OR  At least 25 minutes of vigorous aerobic activity at least 3 days per week for a total of 75 minutes; or a combination of moderate- and vigorous-intensity aerobic activity  AND  Moderate- to high-intensity muscle-strengthening activity at least 2 days per week for additional health benefits.  For Lowering Blood Pressure and Cholesterol An average 40 minutes of moderate- to vigorous-intensity aerobic activity 3 or 4 times per week  What if I can't make it to the time goal? Something is always better than nothing! And everyone has to start somewhere. Even if you've been sedentary for years, today is the day you can begin to make healthy changes in your life. If you don't think you'll make it for 30 or 40 minutes, set a reachable goal for today. You can work up toward your overall goal by increasing your time as you get stronger. Don't let all-or-nothing thinking rob you of doing what you can every day.  Source:http://www.heart.org

## 2024-01-13 ENCOUNTER — Ambulatory Visit: Payer: Self-pay | Admitting: Internal Medicine

## 2024-01-15 ENCOUNTER — Ambulatory Visit
Admission: RE | Admit: 2024-01-15 | Discharge: 2024-01-15 | Disposition: A | Discharge status: Home / Self Care | Source: Ambulatory Visit | Attending: Internal Medicine | Admitting: Internal Medicine

## 2024-01-15 DIAGNOSIS — C50312 Malignant neoplasm of lower-inner quadrant of left female breast: Secondary | ICD-10-CM

## 2024-01-15 DIAGNOSIS — Z1239 Encounter for other screening for malignant neoplasm of breast: Secondary | ICD-10-CM | POA: Diagnosis not present

## 2024-01-15 DIAGNOSIS — Z853 Personal history of malignant neoplasm of breast: Secondary | ICD-10-CM | POA: Diagnosis not present

## 2024-01-15 MED ORDER — GADOPICLENOL 0.5 MMOL/ML IV SOLN
7.0000 mL | Freq: Once | INTRAVENOUS | Status: AC | PRN
Start: 2024-01-15 — End: 2024-01-15
  Administered 2024-01-15: 7 mL via INTRAVENOUS

## 2024-02-06 DIAGNOSIS — Z419 Encounter for procedure for purposes other than remedying health state, unspecified: Secondary | ICD-10-CM | POA: Diagnosis not present

## 2024-03-04 ENCOUNTER — Encounter: Payer: Self-pay | Admitting: Radiology

## 2024-03-08 DIAGNOSIS — Z419 Encounter for procedure for purposes other than remedying health state, unspecified: Secondary | ICD-10-CM | POA: Diagnosis not present

## 2024-04-06 ENCOUNTER — Encounter: Admitting: Radiology

## 2024-04-08 DIAGNOSIS — Z419 Encounter for procedure for purposes other than remedying health state, unspecified: Secondary | ICD-10-CM | POA: Diagnosis not present

## 2024-04-14 DIAGNOSIS — H2513 Age-related nuclear cataract, bilateral: Secondary | ICD-10-CM | POA: Diagnosis not present

## 2024-04-14 DIAGNOSIS — H353131 Nonexudative age-related macular degeneration, bilateral, early dry stage: Secondary | ICD-10-CM | POA: Diagnosis not present

## 2024-04-26 DIAGNOSIS — H2513 Age-related nuclear cataract, bilateral: Secondary | ICD-10-CM | POA: Diagnosis not present

## 2024-04-26 DIAGNOSIS — H43823 Vitreomacular adhesion, bilateral: Secondary | ICD-10-CM | POA: Diagnosis not present

## 2024-04-26 DIAGNOSIS — H353132 Nonexudative age-related macular degeneration, bilateral, intermediate dry stage: Secondary | ICD-10-CM | POA: Diagnosis not present

## 2024-07-08 DIAGNOSIS — Z419 Encounter for procedure for purposes other than remedying health state, unspecified: Secondary | ICD-10-CM | POA: Diagnosis not present

## 2025-01-13 ENCOUNTER — Encounter: Admitting: Internal Medicine
